# Patient Record
Sex: Female | Born: 1965 | Race: Black or African American | Hispanic: No | Marital: Married | Smoking: Never smoker
Health system: Southern US, Community
[De-identification: ages and names within clinical notes are randomized; demographics above are authoritative.]

## PROBLEM LIST (undated history)

## (undated) DIAGNOSIS — R059 Cough, unspecified: Secondary | ICD-10-CM

## (undated) DIAGNOSIS — R05 Cough: Secondary | ICD-10-CM

## (undated) DIAGNOSIS — E559 Vitamin D deficiency, unspecified: Secondary | ICD-10-CM

## (undated) HISTORY — PX: BLEPHAROPLASTY: SUR158

## (undated) HISTORY — DX: Cough: R05

## (undated) HISTORY — DX: Vitamin D deficiency, unspecified: E55.9

## (undated) HISTORY — DX: Cough, unspecified: R05.9

## (undated) HISTORY — PX: CHOLECYSTECTOMY: SHX55

---

## 2012-04-07 ENCOUNTER — Ambulatory Visit (INDEPENDENT_AMBULATORY_CARE_PROVIDER_SITE_OTHER): Payer: Managed Care, Other (non HMO) | Admitting: Physician Assistant

## 2012-04-07 ENCOUNTER — Ambulatory Visit (INDEPENDENT_AMBULATORY_CARE_PROVIDER_SITE_OTHER): Payer: Managed Care, Other (non HMO)

## 2012-04-07 ENCOUNTER — Encounter: Payer: Self-pay | Admitting: Physician Assistant

## 2012-04-07 VITALS — BP 126/86 | HR 79 | Ht 67.0 in | Wt 201.0 lb

## 2012-04-07 DIAGNOSIS — X500XXA Overexertion from strenuous movement or load, initial encounter: Secondary | ICD-10-CM

## 2012-04-07 DIAGNOSIS — R413 Other amnesia: Secondary | ICD-10-CM

## 2012-04-07 DIAGNOSIS — Z131 Encounter for screening for diabetes mellitus: Secondary | ICD-10-CM

## 2012-04-07 DIAGNOSIS — E559 Vitamin D deficiency, unspecified: Secondary | ICD-10-CM

## 2012-04-07 DIAGNOSIS — M7071 Other bursitis of hip, right hip: Secondary | ICD-10-CM

## 2012-04-07 DIAGNOSIS — M25561 Pain in right knee: Secondary | ICD-10-CM

## 2012-04-07 DIAGNOSIS — S99929A Unspecified injury of unspecified foot, initial encounter: Secondary | ICD-10-CM

## 2012-04-07 DIAGNOSIS — Z13220 Encounter for screening for lipoid disorders: Secondary | ICD-10-CM

## 2012-04-07 DIAGNOSIS — M25569 Pain in unspecified knee: Secondary | ICD-10-CM

## 2012-04-07 DIAGNOSIS — Z1239 Encounter for other screening for malignant neoplasm of breast: Secondary | ICD-10-CM

## 2012-04-07 DIAGNOSIS — M76899 Other specified enthesopathies of unspecified lower limb, excluding foot: Secondary | ICD-10-CM

## 2012-04-07 DIAGNOSIS — D649 Anemia, unspecified: Secondary | ICD-10-CM

## 2012-04-07 MED ORDER — MELOXICAM 7.5 MG PO TABS: 7.5000 mg | ORAL_TABLET | Freq: Every day | ORAL | Status: DC

## 2012-04-07 NOTE — Progress Notes (Signed)
Subjective:    Patient ID: Bethany Murillo, female    DOB: [birth year], 46 y.o.   MRN: [id number]  HPI Patient is a 46 yo female who presents to the clinic to establish care and address some problems she has been having with right hip and knee. PMH was reviewed and + for Vit D deficiency and Anemia. She does not take iron tablets regularly but does take Vit D everyday. She has had blephaplasty and c-section. She does not smoke. She has some new concern she would like addressed today.  Patient has had pain on her right side for the last 2 months. She does not remember any trauma to that area; however she does use her hips a lot to move things around she just found that it easier. When the pain started it was only off and on and has progressed to continuously hurting. She's been taking ibuprofen 800 mg 3 times a day and it has helped a lot. The pain is worse when she lays on her right side better when she is laying flat. Denies any numbness or tingling in extremites, back pain, or radiation of pain.  She has also complained of right knee pain. Last month she was trying to pull her daughter up from the floor and she had a twisting injury to her right knee. Right knee and radiating down the medial side of her right leg was very painful for the first 2-3 days. Since her leg has gotten significantly better. She describes the pain as being minor. She is concerned because sometimes when she moves her leg from flexion to extension there is a click. She also feels like sometimes her knee is going to give way when she's doing normal things such as walking.   She also feels like she has started to have some memory loss. It seems like it is mostly with job responsibilies and things people tell her. She is always aware of where she is and memories from the past. She is not currently making list. This has been ongoing for last 2 months. She has no family hx of dementia.   Review of Systems     Objective:   Physical Exam  Constitutional: She is oriented to person, place, and time. She appears well-developed and well-nourished.  HENT:  Head: Normocephalic and atraumatic.  Cardiovascular: Normal rate, regular rhythm and normal heart sounds.   Pulmonary/Chest: Effort normal and breath sounds normal. She has no wheezes.  Musculoskeletal:        Pain with twisting motion at right knee. No pain with palpation of the joint space in right knee. Anterior Drawer and McMurrays test was negative. NO effusion or swelling. Full ROM without pain.  Right Hip: Full ROM of right hip with some pain illicited with external and internal rotation of right leg. Pinpoint pain over the right hip bursa with pressure. Negative straight leg test.  Neurological: She is alert and oriented to person, place, and time. No cranial nerve deficit. Coordination normal.  Skin: Skin is warm and dry.  Psychiatric: She has a normal mood and affect. Her behavior is normal.          Assessment & Plan:  Right hip bursitis-discussed with patient options of hip injection versus symptomatic treatment and anti-inflammatory. Patient would like to try Mobic 7.5 mg daily along with symptomatic treatment given and handout for the next couple weeks and see if there's any improvement before having hip injection. I would like patient to start using ice  for 15-20 minutes at night over the bursa along with stretches and exercises for the IT band. Will follow up in the next 6 weeks.  Right knee pain-will get x-rays today to evaluate the knee pain from a twisting injury. Will call patient with those results. Cannot completely rule out a meniscal tear; however, we would need MRI to make that diagnosis. The but will likely help the right knee pain also. I encouraged patient to keep me stable, start good strengthening exercises of the right leg in followup in 6 weeks. If right knee pain becomes worse or he starts giving way call office and we can get a MRI  scheduled.  Memory difficulty-I. will check labs today to make sure there is no metabolic reason for memory difficulty. I reassured patient that memory changes such as the one she described can be due to many reasons such as psych disorders, stress, fatigue, or even metabolic reason that we are testing for today. If there is no laboratory reason he can start herbal supplements and then perhaps get a Mini-Mental Status Examination.  I went ahead and gave patient a lab slip for fasting cholesterol and CMP. Also refer patient for mammogram.

## 2012-04-07 NOTE — Patient Instructions (Addendum)
Will call with xray of right knee. Start Mobic daily. Will call with lab results.  Hip Bursitis Bursitis is a swelling and soreness (inflammation) of a fluid-filled sac (bursa). This sac overlies and protects the joints.  CAUSES   Injury.   Overuse of the muscles surrounding the joint.   Arthritis.   Gout.   Infection.   Cold weather.   Inadequate warm-up and conditioning prior to activities.  The cause may not be known.  SYMPTOMS   Mild to severe irritation.   Tenderness and swelling over the outside of the hip.   Pain with motion of the hip.   If the bursa becomes infected, a fever may be present. Redness, tenderness, and warmth will develop over the hip.  Symptoms usually lessen in 3 to 4 weeks with treatment, but can come back. TREATMENT If conservative treatment does not work, your caregiver may advise draining the bursa and injecting cortisone into the area. This may speed up the healing process. This may also be used as an initial treatment of choice. HOME CARE INSTRUCTIONS   Apply ice to the affected area for 15 to 20 minutes every 3 to 4 hours while awake for the first 2 days. Put the ice in a plastic bag and place a towel between the bag of ice and your skin.   Rest the painful joint as much as possible, but continue to put the joint through a normal range of motion at least 4 times per day. When the pain lessens, begin normal, slow movements and usual activities to help prevent stiffness of the hip.   Only take over-the-counter or prescription medicines for pain, discomfort, or fever as directed by your caregiver.   Use crutches to limit weight bearing on the hip joint, if advised.   Elevate your painful hip to reduce swelling. Use pillows for propping and cushioning your legs and hips.   Gentle massage may provide comfort and decrease swelling.  SEEK IMMEDIATE MEDICAL CARE IF:   Your pain increases even during treatment, or you are not improving.   You  have a fever.   You have heat and inflammation over the involved bursa.   You have any other questions or concerns.  MAKE SURE YOU:   Understand these instructions.   Will watch your condition.   Will get help right away if you are not doing well or get worse.  Document Released: 01/05/2002 Document Revised: 07/05/2011 Document Reviewed: 08/04/2008 [facility] Patient Information 2012 [place], [place].

## 2012-04-08 LAB — COMPREHENSIVE METABOLIC PANEL
AST: 22 U/L (ref 0–37)
BUN: 6 mg/dL (ref 6–23)
Calcium: 9.6 mg/dL (ref 8.4–10.5)
Chloride: 107 mEq/L (ref 96–112)
Creat: 0.9 mg/dL (ref 0.50–1.10)

## 2012-04-08 LAB — CBC
Hemoglobin: 12.7 g/dL (ref 12.0–15.0)
MCHC: 33.8 g/dL (ref 30.0–36.0)
RBC: 4.53 MIL/uL (ref 3.87–5.11)

## 2012-04-08 LAB — LIPID PANEL
Cholesterol: 161 mg/dL (ref 0–200)
Triglycerides: 145 mg/dL (ref <150)
VLDL: 29 mg/dL (ref 0–40)

## 2012-04-08 LAB — VITAMIN B12: Vitamin B-12: 625 pg/mL (ref 211–911)

## 2012-04-08 LAB — VITAMIN D 25 HYDROXY (VIT D DEFICIENCY, FRACTURES): Vit D, 25-Hydroxy: 22 ng/mL — ABNORMAL LOW (ref 30–89)

## 2012-04-10 ENCOUNTER — Telehealth: Payer: Self-pay | Admitting: *Deleted

## 2012-04-10 NOTE — Telephone Encounter (Signed)
Pt states her Vit D is 1.25mg , 50,000 units 1 tab weekly and states she does need a refill.

## 2012-04-11 NOTE — Telephone Encounter (Signed)
Ok that's an appropriate dose. Stay on that hopefully over time you will get in normal range at that level. We should recheck in 3 months or so. Once you get to appropriate level we can consider tapering your dose to maintain level with OTC D3.

## 2012-04-11 NOTE — Telephone Encounter (Signed)
LMOM

## 2012-04-22 ENCOUNTER — Ambulatory Visit: Payer: Managed Care, Other (non HMO)

## 2012-05-31 ENCOUNTER — Other Ambulatory Visit: Payer: Self-pay | Admitting: Physician Assistant

## 2012-07-31 ENCOUNTER — Ambulatory Visit (INDEPENDENT_AMBULATORY_CARE_PROVIDER_SITE_OTHER): Payer: Managed Care, Other (non HMO) | Admitting: Physician Assistant

## 2012-07-31 ENCOUNTER — Encounter: Payer: Self-pay | Admitting: Physician Assistant

## 2012-07-31 VITALS — BP 133/86 | HR 90 | Temp 98.3°F | Resp 18 | Wt 207.0 lb

## 2012-07-31 DIAGNOSIS — B379 Candidiasis, unspecified: Secondary | ICD-10-CM

## 2012-07-31 DIAGNOSIS — J40 Bronchitis, not specified as acute or chronic: Secondary | ICD-10-CM

## 2012-07-31 DIAGNOSIS — E559 Vitamin D deficiency, unspecified: Secondary | ICD-10-CM

## 2012-07-31 DIAGNOSIS — J069 Acute upper respiratory infection, unspecified: Secondary | ICD-10-CM

## 2012-07-31 DIAGNOSIS — B49 Unspecified mycosis: Secondary | ICD-10-CM

## 2012-07-31 MED ORDER — HYDROCODONE-HOMATROPINE 5-1.5 MG/5ML PO SYRP: 5.0000 mL | ORAL_SOLUTION | Freq: Every evening | ORAL | Status: DC | PRN

## 2012-07-31 MED ORDER — FLUCONAZOLE 150 MG PO TABS: ORAL_TABLET | ORAL | Status: DC

## 2012-07-31 MED ORDER — AMOXICILLIN-POT CLAVULANATE 875-125 MG PO TABS: 1.0000 | ORAL_TABLET | Freq: Two times a day (BID) | ORAL | Status: DC

## 2012-07-31 NOTE — Patient Instructions (Addendum)
Start Augmentin for 10 days and hycodan for cough.   Diflucan for yeast.   Bronchitis Bronchitis is the body's way of reacting to injury and/or infection (inflammation) of the bronchi. Bronchi are the air tubes that extend from the windpipe into the lungs. If the inflammation becomes severe, it may cause shortness of breath. CAUSES  Inflammation may be caused by:  A virus.  Germs (bacteria).  Dust.  Allergens.  Pollutants and many other irritants. The cells lining the bronchial tree are covered with tiny hairs (cilia). These constantly beat upward, away from the lungs, toward the mouth. This keeps the lungs free of pollutants. When these cells become too irritated and are unable to do their job, mucus begins to develop. This causes the characteristic cough of bronchitis. The cough clears the lungs when the cilia are unable to do their job. Without either of these protective mechanisms, the mucus would settle in the lungs. Then you would develop pneumonia. Smoking is a common cause of bronchitis and can contribute to pneumonia. Stopping this habit is the single most important thing you can do to help yourself. TREATMENT   Your caregiver may prescribe an antibiotic if the cough is caused by bacteria. Also, medicines that open up your airways make it easier to breathe. Your caregiver may also recommend or prescribe an expectorant. It will loosen the mucus to be coughed up. Only take over-the-counter or prescription medicines for pain, discomfort, or fever as directed by your caregiver.  Removing whatever causes the problem (smoking, for example) is critical to preventing the problem from getting worse.  Cough suppressants may be prescribed for relief of cough symptoms.  Inhaled medicines may be prescribed to help with symptoms now and to help prevent problems from returning.  For those with recurrent (chronic) bronchitis, there may be a need for steroid medicines. SEEK IMMEDIATE MEDICAL  CARE IF:   During treatment, you develop more pus-like mucus (purulent sputum).  You have a fever.  Your baby is older than 3 months with a rectal temperature of 102 F (38.9 C) or higher.  Your baby is 38 months old or younger with a rectal temperature of 100.4 F (38 C) or higher.  You become progressively more ill.  You have increased difficulty breathing, wheezing, or shortness of breath. It is necessary to seek immediate medical care if you are elderly or sick from any other disease. MAKE SURE YOU:   Understand these instructions.  Will watch your condition.  Will get help right away if you are not doing well or get worse. Document Released: 07/16/2005 Document Revised: 10/08/2011 Document Reviewed: 05/25/2008 [facility] Patient Information 2013 [place], [place].

## 2012-07-31 NOTE — Progress Notes (Signed)
  Subjective:    Patient ID: Bethany Murillo, female    DOB: [birth year], 47 y.o.   MRN: [id number]  HPI She has had a ST, cough, sinus and nasal congestion for 2 weeks. She felt like her throat ws closing up. She took benadryl and did help some. The next day she found some left of amoxicillin. She has taken 3 pills and felt much better for 2-3 days and now the pain and cough have started back up. Hurts to swallow. She also has noticed white thick vaginal discharge that is itchy. She usually can remedy with 7 day monistat but has not worked this time. She denies fever, chills, muscle aches, or ear pain.     History of vit D deficiency. Needs rechecked. Taking 50,000 weekly.   Review of Systems     Objective:   Physical Exam  Constitutional: She is oriented to person, place, and time. She appears well-developed and well-nourished.  HENT:  Head: Normocephalic and atraumatic.  Right Ear: External ear normal.  Left Ear: External ear normal.       TM's clear bilaterally.  Oropharynx erythematous and swollen tonsils. No exudate.  Turbinates red and swollen.  Eyes: Conjunctivae normal are normal.  Neck: Normal range of motion. Neck supple.  Cardiovascular: Normal rate, regular rhythm and normal heart sounds.   Pulmonary/Chest: Effort normal and breath sounds normal. She has no wheezes.  Lymphadenopathy:    She has cervical adenopathy.  Neurological: She is alert and oriented to person, place, and time.  Skin: Skin is warm and dry.  Psychiatric: She has a normal mood and affect. Her behavior is normal.          Assessment & Plan:  URI/Bronchitis-since started abx and felt better will give enoucgh for pt to finish full cycle.Gave handout and discussed symptomatic care.Follow up if not improving.cough syrup given for cough.  Yeast infection-hx of yeast infection after abx and current symptoms. Will give one diflucan for now and then after abx treatment.  Vitamin D- Will recheck level  today.

## 2012-08-01 ENCOUNTER — Telehealth: Payer: Self-pay | Admitting: *Deleted

## 2012-08-01 DIAGNOSIS — M25561 Pain in right knee: Secondary | ICD-10-CM

## 2012-08-01 LAB — VITAMIN D 25 HYDROXY (VIT D DEFICIENCY, FRACTURES): Vit D, 25-Hydroxy: 27 ng/mL — ABNORMAL LOW (ref 30–89)

## 2012-08-01 NOTE — Telephone Encounter (Signed)
Pt called and would like to go ahead w/ the MRI. Please order.

## 2012-08-04 NOTE — Telephone Encounter (Signed)
Patient's husband advised 

## 2012-08-04 NOTE — Telephone Encounter (Signed)
Let pt know referral was sent should be called soon with appt.

## 2012-08-12 ENCOUNTER — Other Ambulatory Visit: Payer: Managed Care, Other (non HMO)

## 2012-08-12 ENCOUNTER — Telehealth: Payer: Self-pay | Admitting: *Deleted

## 2012-08-12 NOTE — Telephone Encounter (Signed)
Pt states that she is needing a prescription for vit D. Looks like on med list it was OTC.  Please clarify

## 2012-08-13 MED ORDER — ERGOCALCIFEROL 1.25 MG (50000 UT) PO CAPS: ORAL_CAPSULE | ORAL | Status: DC

## 2012-08-13 NOTE — Telephone Encounter (Signed)
Sent to pharm ....

## 2012-08-13 NOTE — Telephone Encounter (Signed)
Spoke with husband.  He will inform pt.

## 2012-10-31 ENCOUNTER — Telehealth: Payer: Self-pay | Admitting: Physician Assistant

## 2012-10-31 NOTE — Telephone Encounter (Signed)
Message copied by Jomarie Longs on Fri Oct 31, 2012 12:15 PM ------      Message from: Tandy Gaw L      Created: Mon Aug 04, 2012  9:07 AM       Vitamin D recheck.  ------

## 2012-10-31 NOTE — Telephone Encounter (Signed)
Left message on vm for pt to call back.  

## 2012-10-31 NOTE — Telephone Encounter (Signed)
Reminder for vitamin D recheck? Would you like for [place] to send lab order to lab?

## 2012-11-04 NOTE — Telephone Encounter (Signed)
Left detailed message on machine to call us back and let us know when she would like to get Vit D level rechecked and we would send order to lab. Barry Dienes, LPN

## 2013-01-12 ENCOUNTER — Encounter: Payer: Self-pay | Admitting: Physician Assistant

## 2013-01-12 ENCOUNTER — Ambulatory Visit (INDEPENDENT_AMBULATORY_CARE_PROVIDER_SITE_OTHER): Payer: Managed Care, Other (non HMO) | Admitting: Physician Assistant

## 2013-01-12 ENCOUNTER — Telehealth: Payer: Self-pay | Admitting: Physician Assistant

## 2013-01-12 VITALS — BP 113/76 | HR 68 | Wt 203.0 lb

## 2013-01-12 DIAGNOSIS — M25569 Pain in unspecified knee: Secondary | ICD-10-CM

## 2013-01-12 DIAGNOSIS — M25561 Pain in right knee: Secondary | ICD-10-CM

## 2013-01-12 DIAGNOSIS — E559 Vitamin D deficiency, unspecified: Secondary | ICD-10-CM

## 2013-01-12 DIAGNOSIS — N898 Other specified noninflammatory disorders of vagina: Secondary | ICD-10-CM

## 2013-01-12 DIAGNOSIS — R05 Cough: Secondary | ICD-10-CM

## 2013-01-12 MED ORDER — ALBUTEROL SULFATE HFA 108 (90 BASE) MCG/ACT IN AERS: 2.0000 | INHALATION_SPRAY | Freq: Four times a day (QID) | RESPIRATORY_TRACT | Status: DC | PRN

## 2013-01-12 NOTE — Patient Instructions (Addendum)
Knee Exercises EXERCISES RANGE OF MOTION(ROM) AND STRETCHING EXERCISES These exercises may help you when beginning to rehabilitate your injury. Your symptoms may resolve with or without further involvement from your physician, physical therapist or athletic trainer. While completing these exercises, remember:   Restoring tissue flexibility helps normal motion to return to the joints. This allows healthier, less painful movement and activity.  An effective stretch should be held for at least 30 seconds.  A stretch should never be painful. You should only feel a gentle lengthening or release in the stretched tissue. STRETCH - Knee Extension, Prone  Lie on your stomach on a firm surface, such as a bed or countertop. Place your right / left knee and leg just beyond the edge of the surface. You may wish to place a towel under the far end of your right / left thigh for comfort.  Relax your leg muscles and allow gravity to straighten your knee. Your clinician may advise you to add an ankle weight if more resistance is helpful for you.  You should feel a stretch in the back of your right / left knee. Hold this position for __________ seconds. Repeat __________ times. Complete this stretch __________ times per day. * Your physician, physical therapist or athletic trainer may ask you to add ankle weight to enhance your stretch.  RANGE OF MOTION - Knee Flexion, Active  Lie on your back with both knees straight. (If this causes back discomfort, bend your opposite knee, placing your foot flat on the floor.)  Slowly slide your heel back toward your buttocks until you feel a gentle stretch in the front of your knee or thigh.  Hold for __________ seconds. Slowly slide your heel back to the starting position. Repeat __________ times. Complete this exercise __________ times per day.  STRETCH - Quadriceps, Prone   Lie on your stomach on a firm surface, such as a bed or padded floor.  Bend your right /  left knee and grasp your ankle. If you are unable to reach, your ankle or pant leg, use a belt around your foot to lengthen your reach.  Gently pull your heel toward your buttocks. Your knee should not slide out to the side. You should feel a stretch in the front of your thigh and/or knee.  Hold this position for __________ seconds. Repeat __________ times. Complete this stretch __________ times per day.  STRETCH  Hamstrings, Supine   Lie on your back. Loop a belt or towel over the ball of your right / left foot.  Straighten your right / left knee and slowly pull on the belt to raise your leg. Do not allow the right / left knee to bend. Keep your opposite leg flat on the floor.  Raise the leg until you feel a gentle stretch behind your right / left knee or thigh. Hold this position for __________ seconds. Repeat __________ times. Complete this stretch __________ times per day.  STRENGTHENING EXERCISES These exercises may help you when beginning to rehabilitate your injury. They may resolve your symptoms with or without further involvement from your physician, physical therapist or athletic trainer. While completing these exercises, remember:   Muscles can gain both the endurance and the strength needed for everyday activities through controlled exercises.  Complete these exercises as instructed by your physician, physical therapist or athletic trainer. Progress the resistance and repetitions only as guided.  You may experience muscle soreness or fatigue, but the pain or discomfort you are trying to eliminate should   never worsen during these exercises. If this pain does worsen, stop and make certain you are following the directions exactly. If the pain is still present after adjustments, discontinue the exercise until you can discuss the trouble with your clinician. STRENGTH - Quadriceps, Isometrics  Lie on your back with your right / left leg extended and your opposite knee bent.  Gradually  tense the muscles in the front of your right / left thigh. You should see either your knee cap slide up toward your hip or increased dimpling just above the knee. This motion will push the back of the knee down toward the floor/mat/bed on which you are lying.  Hold the muscle as tight as you can without increasing your pain for __________ seconds.  Relax the muscles slowly and completely in between each repetition. Repeat __________ times. Complete this exercise __________ times per day.  STRENGTH - Quadriceps, Short Arcs   Lie on your back. Place a __________ inch towel roll under your knee so that the knee slightly bends.  Raise only your lower leg by tightening the muscles in the front of your thigh. Do not allow your thigh to rise.  Hold this position for __________ seconds. Repeat __________ times. Complete this exercise __________ times per day.  OPTIONAL ANKLE WEIGHTS: Begin with ____________________, but DO NOT exceed ____________________. Increase in 1 pound/0.5 kilogram increments.  STRENGTH - Quadriceps, Straight Leg Raises  Quality counts! Watch for signs that the quadriceps muscle is working to insure you are strengthening the correct muscles and not "cheating" by substituting with healthier muscles.  Lay on your back with your right / left leg extended and your opposite knee bent.  Tense the muscles in the front of your right / left thigh. You should see either your knee cap slide up or increased dimpling just above the knee. Your thigh may even quiver.  Tighten these muscles even more and raise your leg 4 to 6 inches off the floor. Hold for __________ seconds.  Keeping these muscles tense, lower your leg.  Relax the muscles slowly and completely in between each repetition. Repeat __________ times. Complete this exercise __________ times per day.  STRENGTH - Hamstring, Curls  Lay on your stomach with your legs extended. (If you lay on a bed, your feet may hang over the  edge.)  Tighten the muscles in the back of your thigh to bend your right / left knee up to 90 degrees. Keep your hips flat on the bed/floor.  Hold this position for __________ seconds.  Slowly lower your leg back to the starting position. Repeat __________ times. Complete this exercise __________ times per day.  OPTIONAL ANKLE WEIGHTS: Begin with ____________________, but DO NOT exceed ____________________. Increase in 1 pound/0.5 kilogram increments.  STRENGTH  Quadriceps, Squats  Stand in a door frame so that your feet and knees are in line with the frame.  Use your hands for balance, not support, on the frame.  Slowly lower your weight, bending at the hips and knees. Keep your lower legs upright so that they are parallel with the door frame. Squat only within the range that does not increase your knee pain. Never let your hips drop below your knees.  Slowly return upright, pushing with your legs, not pulling with your hands. Repeat __________ times. Complete this exercise __________ times per day.  STRENGTH - Quadriceps, Wall Slides  Follow guidelines for form closely. Increased knee pain often results from poorly placed feet or knees.  Lean against   a smooth wall or door and walk your feet out 18-24 inches. Place your feet hip-width apart.  Slowly slide down the wall or door until your knees bend __________ degrees.* Keep your knees over your heels, not your toes, and in line with your hips, not falling to either side.  Hold for __________ seconds. Stand up to rest for __________ seconds in between each repetition. Repeat __________ times. Complete this exercise __________ times per day. * Your physician, physical therapist or athletic trainer will alter this angle based on your symptoms and progress. Document Released: 05/30/2005 Document Revised: 10/08/2011 Document Reviewed: 10/28/2008 [facility] Patient Information 2014 [place], [place]. Candida Infection, Adult A candida  infection (also called yeast, fungus and Monilia infection) is an overgrowth of yeast that can occur anywhere on the body. A yeast infection commonly occurs in warm, moist body areas. Usually, the infection remains localized but can spread to become a systemic infection. A yeast infection may be a sign of a more severe disease such as diabetes, leukemia, or AIDS. A yeast infection can occur in both men and women. In women, Candida vaginitis is a vaginal infection. It is one of the most common causes of vaginitis. Men usually do not have symptoms or know they have an infection until other problems develop. Men may find out they have a yeast infection because their sex partner has a yeast infection. Uncircumcised men are more likely to get a yeast infection than circumcised men. This is because the uncircumcised glans is not exposed to air and does not remain as dry as that of a circumcised glans. Older adults may develop yeast infections around dentures. CAUSES  Women  Antibiotics.  Steroid medication taken for a long time.  Being overweight (obese).  Diabetes.  Poor immune condition.  Certain serious medical conditions.  Immune suppressive medications for organ transplant patients.  Chemotherapy.  Pregnancy.  Menstration.  Stress and fatigue.  Intravenous drug use.  Oral contraceptives.  Wearing tight-fitting clothes in the crotch area.  Catching it from a sex partner who has a yeast infection.  Spermicide.  Intravenous, urinary, or other catheters. Men  Catching it from a sex partner who has a yeast infection.  Having oral or anal sex with a person who has the infection.  Spermicide.  Diabetes.  Antibiotics.  Poor immune system.  Medications that suppress the immune system.  Intravenous drug use.  Intravenous, urinary, or other catheters. SYMPTOMS  Women  Thick, white vaginal discharge.  Vaginal itching.  Redness and swelling in and around the  vagina.  Irritation of the lips of the vagina and perineum.  Blisters on the vaginal lips and perineum.  Painful sexual intercourse.  Low blood sugar (hypoglycemia).  Painful urination.  Bladder infections.  Intestinal problems such as constipation, indigestion, bad breath, bloating, increase in gas, diarrhea, or loose stools. Men  Men may develop intestinal problems such as constipation, indigestion, bad breath, bloating, increase in gas, diarrhea, or loose stools.  Dry, cracked skin on the penis with itching or discomfort.  Jock itch.  Dry, flaky skin.  Athlete's foot.  Hypoglycemia. DIAGNOSIS  Women  A history and an exam are performed.  The discharge may be examined under a microscope.  A culture may be taken of the discharge. Men  A history and an exam are performed.  Any discharge from the penis or areas of cracked skin will be looked at under the microscope and cultured.  Stool samples may be cultured. TREATMENT  Women  Vaginal  antifungal suppositories and creams.  Medicated creams to decrease irritation and itching on the outside of the vagina.  Warm compresses to the perineal area to decrease swelling and discomfort.  Oral antifungal medications.  Medicated vaginal suppositories or cream for repeated or recurrent infections.  Wash and dry the irritation areas before applying the cream.  Eating yogurt with lactobacillus may help with prevention and treatment.  Sometimes painting the vagina with gentian violet solution may help if creams and suppositories do not work. Men  Antifungal creams and oral antifungal medications.  Sometimes treatment must continue for 30 days after the symptoms go away to prevent recurrence. HOME CARE INSTRUCTIONS  Women  Use cotton underwear and avoid tight-fitting clothing.  Avoid colored, scented toilet paper and deodorant tampons or pads.  Do not douche.  Keep your diabetes under control.  Finish all  the prescribed medications.  Keep your skin clean and dry.  Consume milk or yogurt with lactobacillus active culture regularly. If you get frequent yeast infections and think that is what the infection is, there are over-the-counter medications that you can get. If the infection does not show healing in 3 days, talk to your caregiver.  Tell your sex partner you have a yeast infection. Your partner may need treatment also, especially if your infection does not clear up or recurs. Men  Keep your skin clean and dry.  Keep your diabetes under control.  Finish all prescribed medications.  Tell your sex partner that you have a yeast infection so they can be treated if necessary. SEEK MEDICAL CARE IF:   Your symptoms do not clear up or worsen in one week after treatment.  You have an oral temperature above 102 F (38.9 C).  You have trouble swallowing or eating for a prolonged time.  You develop blisters on and around your vagina.  You develop vaginal bleeding and it is not your menstrual period.  You develop abdominal pain.  You develop intestinal problems as mentioned above.  You get weak or lightheaded.  You have painful or increased urination.  You have pain during sexual intercourse. MAKE SURE YOU:   Understand these instructions.  Will watch your condition.  Will get help right away if you are not doing well or get worse. Document Released: 08/23/2004 Document Revised: 10/08/2011 Document Reviewed: 12/05/2009 [facility] Patient Information 2014 [place], [place].

## 2013-01-12 NOTE — Telephone Encounter (Signed)
Patient was seen today and she does want to have the reaction knee brace and what are the steps to get that. If you can call her back [phone]. Thanks

## 2013-01-12 NOTE — Progress Notes (Signed)
  Subjective:    Patient ID: Bethany Murillo, female    DOB: [birth year], 47 y.o.   MRN: [id number]  HPI Patient presents to the clinic to follow up on vitamin D levels, right knee pain, vaginal itching and cough.   Vitamin D has not always been consistent with taking D3 but has taken for last month. She does feel like she has more energy.   Right knee pain has improved but continues to catch and give way at least a couple of times a week. Pain is dull and achy. Never fallen due to knee complication. Takes ibuprofen occasionally but not regularly. Does help some. X-rays done at last visit did not show any significant degeneration.  Pt has had multiple yeast infections this year. She reports she has went to [facility]. She feels like one is starting today because she has started to feel more irritated vaginally. No discharge today. Not aware of any triggers. Taking probiotic.   Pt concerned with cough today. She usually has 2-3 cases of bronchitis every year. She denies any fever, chills, SOB, n/v/d, sinus pressure, ear pain or ST. Her cough is dry. Denies any wheezing. Never had any dx of asthma.      Review of Systems     Objective:   Physical Exam  Constitutional: She appears well-developed and well-nourished.  HENT:  Head: Normocephalic and atraumatic.  Right Ear: External ear normal.  Left Ear: External ear normal.  Nose: Nose normal.  Mouth/Throat: Oropharynx is clear and moist.  Eyes: Conjunctivae are normal.  Neck: Normal range of motion. Neck supple.  Cardiovascular: Normal rate, regular rhythm and normal heart sounds.   Pulmonary/Chest: Effort normal and breath sounds normal. She has no wheezes.  Musculoskeletal:  Right knee: NOrmal ROM. Strength 5/5. No joint tenderness. Twisting motion creates pain medially. McMurrays negative. Anterior drawer negative.   Lymphadenopathy:    She has no cervical adenopathy.  Skin: Skin is warm and dry.  Psychiatric: She has a normal mood  and affect. Her behavior is normal.          Assessment & Plan:  Vitamin D def- will recheck Vit D level.   Right knee pain- suspect meniscal tear in process of healing but perhaps being re-injured. Order MRI earlier patient did not get. Offered to put in knee brace for stablilty and along with PT at home and regular NSAIDs see if improves. Pt declined knee brace today. Follow up if worsening. 38month f/u  Cough-Peak flows were all in the green. REassured pt that cough could be due to PND. Discussed allergy meds daily such as zyrtec/claritin. Decongestant for a couple of days may help PND. Follow up if not improving.   Vaginal itching- Wet prep done today. Will call with results. Could be having some vaginal atropy setting in as approaching menopause. Discussed lubrication and ways to prevent yeast infection/BV handout given.

## 2013-01-13 LAB — VITAMIN D 25 HYDROXY (VIT D DEFICIENCY, FRACTURES): Vit D, 25-Hydroxy: 43 ng/mL (ref 30–89)

## 2013-01-14 DIAGNOSIS — E559 Vitamin D deficiency, unspecified: Secondary | ICD-10-CM

## 2013-01-14 HISTORY — DX: Vitamin D deficiency, unspecified: E55.9

## 2013-01-14 NOTE — Telephone Encounter (Signed)
Please schedule with patient to come in for a nurse visit to get brace placed.

## 2013-01-15 NOTE — Telephone Encounter (Signed)
LMOM for pt to call back & schedule.

## 2013-01-20 NOTE — Telephone Encounter (Signed)
Left another message for pt to schedule nurse visit if she still feels like she needs the knee brace.

## 2013-01-23 ENCOUNTER — Encounter: Payer: Self-pay | Admitting: Physician Assistant

## 2013-01-23 ENCOUNTER — Ambulatory Visit (INDEPENDENT_AMBULATORY_CARE_PROVIDER_SITE_OTHER): Payer: BC Managed Care – PPO | Admitting: Physician Assistant

## 2013-01-23 VITALS — BP 137/91 | HR 67 | Wt 203.0 lb

## 2013-01-23 DIAGNOSIS — R05 Cough: Secondary | ICD-10-CM

## 2013-01-23 DIAGNOSIS — R062 Wheezing: Secondary | ICD-10-CM

## 2013-01-23 DIAGNOSIS — M25561 Pain in right knee: Secondary | ICD-10-CM

## 2013-01-23 DIAGNOSIS — M25569 Pain in unspecified knee: Secondary | ICD-10-CM

## 2013-01-23 DIAGNOSIS — J209 Acute bronchitis, unspecified: Secondary | ICD-10-CM

## 2013-01-23 MED ORDER — HYDROCODONE-HOMATROPINE 5-1.5 MG/5ML PO SYRP: 5.0000 mL | ORAL_SOLUTION | Freq: Every evening | ORAL | Status: DC | PRN

## 2013-01-23 MED ORDER — ALBUTEROL SULFATE (2.5 MG/3ML) 0.083% IN NEBU
2.5000 mg | INHALATION_SOLUTION | Freq: Once | RESPIRATORY_TRACT | Status: AC
  Administered 2013-01-23: 2.5 mg via RESPIRATORY_TRACT

## 2013-01-23 MED ORDER — PREDNISONE 20 MG PO TABS: ORAL_TABLET | ORAL | Status: DC

## 2013-01-23 MED ORDER — AZITHROMYCIN 250 MG PO TABS: ORAL_TABLET | ORAL | Status: DC

## 2013-01-23 MED ORDER — METHYLPREDNISOLONE SODIUM SUCC 125 MG IJ SOLR
125.0000 mg | Freq: Once | INTRAMUSCULAR | Status: AC
  Administered 2013-01-23: 125 mg via INTRAMUSCULAR

## 2013-01-23 NOTE — Patient Instructions (Addendum)

## 2013-01-23 NOTE — Progress Notes (Signed)
  Subjective:    Patient ID: Bethany Murillo. Bethany Murillo, female    DOB: 1966-05-24, 47 y.o.   MRN: [id number]  HPI Patient presents to the clinic with wet productive cough and feeling tight and SOB. She had a cough last Friday when was seen in clinic for other reasons but though was just a cold. Her cough continues to get worse. She is very tight in her chest, SOB and wheezing. She has used inhaler and does help. She has not been diagnosed with any lung disorders. She denies any sinus pressure, ST, ear pain. Her sputum is greenish/white.    Pt would like to get knee brace that we talked about last Friday.    Review of Systems     Objective:   Physical Exam  Constitutional: She is oriented to person, place, and time. She appears well-developed and well-nourished.  HENT:  Head: Normocephalic and atraumatic.  Right Ear: External ear normal.  Left Ear: External ear normal.  Nose: Nose normal.  Mouth/Throat: Oropharynx is clear and moist. No oropharyngeal exudate.  Eyes: Conjunctivae are normal. Right eye exhibits no discharge. Left eye exhibits no discharge.  Neck: Normal range of motion. Neck supple. No thyromegaly present.  Cardiovascular: Normal rate, regular rhythm and normal heart sounds.   Pulmonary/Chest:  Decreased effort. Bilateral wheezing and rhonchi. Wet hacking cough.   Lymphadenopathy:    She has no cervical adenopathy.  Neurological: She is alert and oriented to person, place, and time.  Skin: Skin is warm and dry.  Psychiatric: She has a normal mood and affect. Her behavior is normal.          Assessment & Plan:  Acute bronchitis- Albuterol nebulizer given in office. Solumedrol 125mg  given IM. Prednisone for 5 days and zpak sent to pharmacy. Continue to use albuterol inhaler as needed. Hycodan given for cough at night. If not improving by Monday call office. Discussed exacerbations have been more frequent than would like. Would like to call and set up spironmetry when  feeling better.   Right knee pain- placed in knee support brace today. Continue with home PT. Ibprofen regularly. Follow up if not improving in next 4 weeks.

## 2013-01-26 NOTE — Telephone Encounter (Signed)
Pt came in for ov & received knee brace.

## 2013-01-28 ENCOUNTER — Encounter: Payer: Self-pay | Admitting: Family Medicine

## 2013-01-28 ENCOUNTER — Ambulatory Visit (INDEPENDENT_AMBULATORY_CARE_PROVIDER_SITE_OTHER): Payer: BC Managed Care – PPO | Admitting: Family Medicine

## 2013-01-28 ENCOUNTER — Ambulatory Visit (INDEPENDENT_AMBULATORY_CARE_PROVIDER_SITE_OTHER): Payer: BC Managed Care – PPO

## 2013-01-28 VITALS — BP 127/86 | HR 76 | Temp 98.6°F | Wt 202.0 lb

## 2013-01-28 DIAGNOSIS — R05 Cough: Secondary | ICD-10-CM

## 2013-01-28 DIAGNOSIS — R059 Cough, unspecified: Secondary | ICD-10-CM

## 2013-01-28 DIAGNOSIS — J4531 Mild persistent asthma with (acute) exacerbation: Secondary | ICD-10-CM

## 2013-01-28 DIAGNOSIS — J45901 Unspecified asthma with (acute) exacerbation: Secondary | ICD-10-CM

## 2013-01-28 DIAGNOSIS — R0602 Shortness of breath: Secondary | ICD-10-CM

## 2013-01-28 MED ORDER — PREDNISONE 20 MG PO TABS: ORAL_TABLET | ORAL | Status: AC

## 2013-01-28 MED ORDER — LEVOFLOXACIN 750 MG PO TABS: 750.0000 mg | ORAL_TABLET | Freq: Every day | ORAL | Status: AC

## 2013-01-28 MED ORDER — IPRATROPIUM BROMIDE HFA 17 MCG/ACT IN AERS: 2.0000 | INHALATION_SPRAY | Freq: Four times a day (QID) | RESPIRATORY_TRACT | Status: DC

## 2013-01-28 NOTE — Progress Notes (Signed)
CC: Bethany Murillo is a 47 y.o. female is here for Shortness of Breath   Subjective: HPI:  Patient presents for cough that has been present for 6 weeks. She was seen last Friday for the same symptoms described as severe cough, moderate shortness of breath both of which are worse with exertion or lying down at night. Prior to being seen last week and are been on azithromycin. When she saw our office last week she was started on azithromycin again, received Solu-Medrol and started on prednisone taper beginning at 40 mg a day which she is continued on. Unfortunately symptoms were worsening from a breathing standpoint and she was seen on the 30th at [facility]. Antibiotic was changed to clarithromycin an x-ray was obtained to read shows no airway abnormality.  Today she states she feels better from an anxiety standpoint compared to when she was at [facility] but cough and shortness of breath has not improved to any degree. Symptoms are slightly improved with albuterol and codeine cough syrup only temporarily. She is unable to get more than 2-4 hours of sleep due to the cough waking her.  She currently denies fevers, chills, nausea, vomiting, appetite change, myalgias, facial pain, nasal congestion, headache motor or sensory disturbances. She reports chest congestion but no chest pain and less coughing which results in moderate tightness.Marland Kitchen describes her cough is productive without blood.   Review Of Systems Outlined In HPI  Past Medical History  Diagnosis Date  . Unspecified vitamin D deficiency 01/14/2013     Family History  Problem Relation Age of Onset  . Cancer Mother     colon cancer  . Stroke Father   . Cancer Maternal Grandmother      History  Substance Use Topics  . Smoking status: Never Smoker   . Smokeless tobacco: Not on file  . Alcohol Use: No     Objective: Filed Vitals:   01/28/13 1309  BP: 127/86  Pulse: 76  Temp: 98.6 F (37 C)    General: Alert and Oriented, No  Acute Distress HEENT: Pupils equal, round, reactive to light. Conjunctivae clear.  External ears unremarkable, canals clear with intact TMs with appropriate landmarks.  Middle ear appears open without effusion. Pink inferior turbinates.  Moist mucous membranes, pharynx without inflammation nor lesions.  Neck supple without palpable lymphadenopathy nor abnormal masses. Lungs: Diffuse moderate to severe rhonchi in all lung fields with good air movement without wheezing nor rales nor signs of consolidation. Frequent coughing. Rhonchi can be heard standing next to her while talking Cardiac: Regular rate and rhythm. Normal S1/S2.  No murmurs, rubs, nor gallops.   Extremities: No peripheral edema.  Strong peripheral pulses.  Mental Status: No depression, anxiety, nor agitation. Skin: Warm and dry.  Assessment & Plan: Bethany Murillo was seen today for shortness of breath.  Diagnoses and associated orders for this visit:  Cough - DG Chest 2 View; Future - ipratropium (ATROVENT HFA) 17 MCG/ACT inhaler; Inhale 2 puffs into the lungs 4 (four) times daily. - predniSONE (DELTASONE) 20 MG tablet; Three tabs daily days 1-3, two tabs daily days 4-6, one tab daily days 7-9, half tab daily days 10-13. - levofloxacin (LEVAQUIN) 750 MG tablet; Take 1 tablet (750 mg total) by mouth daily.  Reactive airway disease, mild persistent, with acute exacerbation - ipratropium (ATROVENT HFA) 17 MCG/ACT inhaler; Inhale 2 puffs into the lungs 4 (four) times daily. - predniSONE (DELTASONE) 20 MG tablet; Three tabs daily days 1-3, two tabs daily days  4-6, one tab daily days 7-9, half tab daily days 10-13. - levofloxacin (LEVAQUIN) 750 MG tablet; Take 1 tablet (750 mg total) by mouth daily.    Chest x-ray was obtained and reviewed with the patient showing no acute airspace disease, furthermore oxygen saturation without desaturation while walking reassurance provided. Stepup therapy with adding ipratropium to albuterol scheduled  every 4 hours for the next 48 hours then as needed, increasing prednisone taper 60 mg, stop clarithromycin begin Levaquin.Signs and symptoms requring emergent/urgent reevaluation were discussed with the patient. Followup PCP 1 week  Return in about 1 week (around 02/05/2013).

## 2013-04-15 ENCOUNTER — Ambulatory Visit: Payer: BC Managed Care – PPO | Admitting: Physician Assistant

## 2013-11-13 ENCOUNTER — Encounter: Payer: Self-pay | Admitting: Physician Assistant

## 2013-11-13 ENCOUNTER — Ambulatory Visit (INDEPENDENT_AMBULATORY_CARE_PROVIDER_SITE_OTHER): Payer: BC Managed Care – PPO | Admitting: Physician Assistant

## 2013-11-13 VITALS — BP 127/81 | HR 76 | Temp 98.0°F | Ht 67.0 in | Wt 211.0 lb

## 2013-11-13 DIAGNOSIS — R053 Chronic cough: Secondary | ICD-10-CM

## 2013-11-13 DIAGNOSIS — J45909 Unspecified asthma, uncomplicated: Secondary | ICD-10-CM

## 2013-11-13 DIAGNOSIS — R059 Cough, unspecified: Secondary | ICD-10-CM

## 2013-11-13 DIAGNOSIS — R062 Wheezing: Secondary | ICD-10-CM

## 2013-11-13 DIAGNOSIS — R05 Cough: Secondary | ICD-10-CM

## 2013-11-13 MED ORDER — BENZONATATE 200 MG PO CAPS: 200.0000 mg | ORAL_CAPSULE | Freq: Three times a day (TID) | ORAL | Status: DC | PRN

## 2013-11-13 MED ORDER — METHYLPREDNISOLONE ACETATE 80 MG/ML IJ SUSP
80.0000 mg | Freq: Once | INTRAMUSCULAR | Status: AC
  Administered 2013-11-13: 80 mg via INTRAMUSCULAR

## 2013-11-13 MED ORDER — MONTELUKAST SODIUM 10 MG PO TABS: 10.0000 mg | ORAL_TABLET | Freq: Every day | ORAL | Status: DC

## 2013-11-13 MED ORDER — HYDROCOD POLST-CHLORPHEN POLST 10-8 MG/5ML PO LQCR: 5.0000 mL | Freq: Two times a day (BID) | ORAL | Status: DC | PRN

## 2013-11-13 MED ORDER — FLUTICASONE-SALMETEROL 100-50 MCG/DOSE IN AEPB: 1.0000 | INHALATION_SPRAY | Freq: Two times a day (BID) | RESPIRATORY_TRACT | Status: DC

## 2013-11-13 NOTE — Progress Notes (Addendum)
   Subjective:    Patient ID: Bethany SchleinAngela M. Ashley RoyaltyMatthews, female    DOB: 04/29/1966, 48 y.o.   MRN: [id number]  HPI Pt presents to the clinic with cough that has been persistent for 4 months. She has been being seen by [facility] due to work schedule. She reports taking 5 abx and at least 5-6 rounds of steroids. She does get some better with abx and steroid but usually only last for a couple of days or so. She was sent to pulmonologist but he was sleep focused and did not seem to listen. He gave her bactrim and said she needed a sleep study. All CXR have been negative for any infiltrates. She did have positive IGG pertussis which she was treated despite IgM being negative. She went on vacation for 1 week and did get some better. When she got back to work extreme constant cough continued. She had vents cleaned at work. Not taking anything daily. Does have as needed inhaler that she does use with benefit some. Her cough is productive and day and night. She has started to have some wheezing as well. No fever, chills, nausea, vomiting, sinus pressure, ear pain, ST. Never smoked. Hx of asthmatic bronchitis once or twice a year but no significant problems.       Review of Systems     Objective:   Physical Exam  Constitutional: She is oriented to person, place, and time. She appears well-developed and well-nourished.  HENT:  Head: Normocephalic and atraumatic.  Right Ear: External ear normal.  Left Ear: External ear normal.  Nose: Nose normal.  Mouth/Throat: Oropharynx is clear and moist. No oropharyngeal exudate.  Eyes: Conjunctivae are normal. Right eye exhibits no discharge. Left eye exhibits no discharge.  Neck: Normal range of motion. Neck supple.  Cardiovascular: Normal rate, regular rhythm and normal heart sounds.   Pulmonary/Chest:  significant bilateral wheezing and rhonchi.   Lymphadenopathy:    She has no cervical adenopathy.  Neurological: She is alert and oriented to person, place, and time.   Skin: Skin is dry.  Psychiatric: She has a normal mood and affect. Her behavior is normal.          Assessment & Plan:  Chronic cough/asthmatic bronchitis/wheezing - pt did not want any more oral steroids. Pulse ox 95 percent. Pt declined spirometry today. She did have positive IGG response to pertussis but IGM was negative. Allergy blood testing was done and negative results with allergens tested.  Gave depo medrol 80mg  IM in office today. Pt is hesitant to start daily inhaler but discussed it appears she does have some type of ongoing lung issues possible COPD despite negative smoking hx. After 5 abx I do not feel like she needs any more abx's. Advair sample was given with rx. Discussed to stay on until she sees pulmonologist. Start singulair in combination with OTC zyrtec. Since cough is most bothersome symptoms tussionex for night and tessalon during the day was given. Discussed with pt I feel cough is coming from lung inflammation and will decrease with daily inhaler. Follow up as needed.   Spent 30 minutes with pt and greater than 50 percent of visit spent counseling pt regarding next steps in treatment of chronic cough.

## 2013-11-13 NOTE — Patient Instructions (Signed)
Will refer to pulmonology. Dr. Rogelia Mireeicht.   Start Advair 1 puff twice a day. Start Singulair daily.

## 2013-11-30 ENCOUNTER — Telehealth: Payer: Self-pay

## 2013-11-30 DIAGNOSIS — B379 Candidiasis, unspecified: Secondary | ICD-10-CM

## 2013-11-30 MED ORDER — FLUCONAZOLE 150 MG PO TABS: 150.0000 mg | ORAL_TABLET | Freq: Once | ORAL | Status: DC

## 2013-11-30 NOTE — Telephone Encounter (Signed)
Medication sent to pharmacy  

## 2013-11-30 NOTE — Telephone Encounter (Signed)
Ok for diflucan 150mg  once number 1 no refills. If not improving needs to be seen.

## 2013-11-30 NOTE — Telephone Encounter (Signed)
Bethany Murillo complains of white vaginal discharge for 4 days. She states she gets yeast infections frequently. Denies fever, chills, sweats or pelvic pain. She would like Diflucan sent to [facility] [address].

## 2013-12-17 ENCOUNTER — Institutional Professional Consult (permissible substitution): Payer: BC Managed Care – PPO | Admitting: Critical Care Medicine

## 2013-12-24 ENCOUNTER — Ambulatory Visit (INDEPENDENT_AMBULATORY_CARE_PROVIDER_SITE_OTHER): Payer: BC Managed Care – PPO | Admitting: Critical Care Medicine

## 2013-12-24 ENCOUNTER — Encounter: Payer: Self-pay | Admitting: Critical Care Medicine

## 2013-12-24 VITALS — BP 126/86 | HR 77 | Temp 98.4°F | Ht 68.0 in | Wt 207.0 lb

## 2013-12-24 DIAGNOSIS — R059 Cough, unspecified: Secondary | ICD-10-CM | POA: Insufficient documentation

## 2013-12-24 DIAGNOSIS — J309 Allergic rhinitis, unspecified: Secondary | ICD-10-CM

## 2013-12-24 DIAGNOSIS — R05 Cough: Secondary | ICD-10-CM | POA: Insufficient documentation

## 2013-12-24 MED ORDER — OMEPRAZOLE 20 MG PO CPDR: 20.0000 mg | DELAYED_RELEASE_CAPSULE | Freq: Every day | ORAL | Status: DC

## 2013-12-24 MED ORDER — FLUTICASONE PROPIONATE 50 MCG/ACT NA SUSP: 2.0000 | Freq: Every day | NASAL | Status: DC

## 2013-12-24 MED ORDER — HYDROCOD POLST-CHLORPHEN POLST 10-8 MG/5ML PO LQCR: 5.0000 mL | Freq: Two times a day (BID) | ORAL | Status: DC | PRN

## 2013-12-24 MED ORDER — CHLORPHENIRAMINE MALEATE 4 MG PO TABS: ORAL_TABLET | ORAL | Status: DC

## 2013-12-24 MED ORDER — PREDNISONE 10 MG PO TABS: ORAL_TABLET | ORAL | Status: DC

## 2013-12-24 MED ORDER — BENZONATATE 200 MG PO CAPS: 200.0000 mg | ORAL_CAPSULE | Freq: Three times a day (TID) | ORAL | Status: DC | PRN

## 2013-12-24 NOTE — Progress Notes (Signed)
Subjective:    Patient ID: Bethany Murillo. Bethany Murillo, female    DOB: 1965/10/17, 48 y.o.   MRN: [id number]  HPI Comments: Chronic cough and dyspnea, started 07/2013.  Min issues before. Dx bronchitis. Rx  Symptoms are better but persists  Cough This is a new problem. The current episode started more than 1 month ago. The problem has been gradually improving. Episode frequency: periodic cough now. The cough is productive of sputum (mucus now white, before was yellow and thick). Associated symptoms include a fever, headaches, hemoptysis, shortness of breath and wheezing. Pertinent negatives include no chest pain, chills, ear congestion, ear pain, heartburn, myalgias, nasal congestion, postnasal drip, rash, rhinorrhea, sore throat, sweats or weight loss. Associated symptoms comments: Notes scratchy throat No real postnasal drip or sneezing.  No sinus pressure. Had hemoptysis in 09/2013 with severe paroxysms Had wheezing now gone. The symptoms are aggravated by cold air, exercise, stress and dust. She has tried a beta-agonist inhaler, oral steroids and prescription cough suppressant (inhalers did not help, steroids/abx helped, rx cough med helped) for the symptoms. The treatment provided moderate relief. Her past medical history is significant for bronchitis. There is no history of asthma, bronchiectasis, COPD, emphysema, environmental allergies or pneumonia. allergy skin test +/- ?dust only    Past Medical History  Diagnosis Date  . Unspecified vitamin D deficiency 01/14/2013  . Cough      Family History  Problem Relation Age of Onset  . Cancer Mother     colon cancer  . Stroke Father   . Cancer Maternal Grandmother      History   Social History  . Marital Status: Married    Spouse Name: N/A    Number of Children: N/A  . Years of Education: N/A   Occupational History  . RN     Hospice   Social History Main Topics  . Smoking status: Never Smoker   . Smokeless tobacco: Never Used  .  Alcohol Use: No  . Drug Use: No  . Sexual Activity: Yes   Other Topics Concern  . Not on file   Social History Narrative  . No narrative on file     No Known Allergies   Outpatient Prescriptions Prior to Visit  Medication Sig Dispense Refill  . benzonatate (TESSALON) 200 MG capsule Take 1 capsule (200 mg total) by mouth 3 (three) times daily as needed for cough.  30 capsule  0  . chlorpheniramine-HYDROcodone (TUSSIONEX) 10-8 MG/5ML LQCR Take 5 mLs by mouth every 12 (twelve) hours as needed for cough (cough, will cause drowsiness.).  120 mL  0  . fluconazole (DIFLUCAN) 150 MG tablet Take 1 tablet (150 mg total) by mouth once.  1 tablet  0  . Fluticasone-Salmeterol (ADVAIR) 100-50 MCG/DOSE AEPB Inhale 1 puff into the lungs 2 (two) times daily.  1 each  2  . montelukast (SINGULAIR) 10 MG tablet Take 1 tablet (10 mg total) by mouth at bedtime.  30 tablet  2   No facility-administered medications prior to visit.      Review of Systems  Constitutional: Positive for fever. Negative for chills, weight loss, diaphoresis, activity change, appetite change, fatigue and unexpected weight change.  HENT: Positive for voice change. Negative for congestion, dental problem, ear discharge, ear pain, facial swelling, hearing loss, mouth sores, nosebleeds, postnasal drip, rhinorrhea, sinus pressure, sneezing, sore throat, tinnitus and trouble swallowing.   Eyes: Negative for photophobia, discharge, itching and visual disturbance.  Respiratory: Positive for cough, hemoptysis, shortness  of breath and wheezing. Negative for apnea, choking, chest tightness and stridor.   Cardiovascular: Negative for chest pain, palpitations and leg swelling.  Gastrointestinal: Negative.  Negative for heartburn, nausea, vomiting, abdominal pain, constipation, blood in stool and abdominal distention.  Genitourinary: Negative for dysuria, urgency, frequency, hematuria, flank pain, decreased urine volume and difficulty  urinating.  Musculoskeletal: Negative for arthralgias, back pain, gait problem, joint swelling, myalgias, neck pain and neck stiffness.  Skin: Negative for color change, pallor and rash.  Allergic/Immunologic: Negative for environmental allergies.  Neurological: Positive for headaches. Negative for dizziness, tremors, seizures, syncope, speech difficulty, weakness, light-headedness and numbness.  Hematological: Negative for adenopathy. Does not bruise/bleed easily.  Psychiatric/Behavioral: Negative for confusion, sleep disturbance and agitation. The patient is not nervous/anxious.        Objective:   Physical Exam Filed Vitals:   12/24/13 1017  BP: 126/86  Pulse: 77  Temp: 98.4 F (36.9 C)  TempSrc: Oral  Height: 5\' 8"  (1.727 m)  Weight: 207 lb (93.895 kg)  SpO2: 98%    Gen: Pleasant, well-nourished, in no distress,  normal affect  ENT: No lesions,  mouth clear,  oropharynx clear, moderate postnasal drip, bilateral nasal inflammation with turbinate edema  Neck: No JVD, no TMG, no carotid bruits  Lungs: No use of accessory muscles, no dullness to percussion, prominent upper airway pseudo-wheeze and raspy cough with upper airway rhonchi  Cardiovascular: RRR, heart sounds normal, no murmur or gallops, no peripheral edema  Abdomen: soft and NT, no HSM,  BS normal  Musculoskeletal: No deformities, no cyanosis or clubbing  Neuro: alert, non focal  Skin: Warm, no lesions or rashes  No results found.  Chest x-ray normal Allergy profile normal with a low IgE level 12/24/2013 Spirometry normal 12/24/2013      Assessment & Plan:   Cough Cyclic cough secondary to upper airway instability with vocal cord dysfunction and postnasal drip syndrome. Suspected significant allergic component however allergy profile is normal with low IgE level Note chest x-ray shows no active process. There is significant upper airway rhonchi and this patient may yet require an airway  examination Spirometry is normal therefore doubt significant lower airway inflammation and in fact the Advair may be aggravating the cough Plan Omeprazole one daily Reflux diet Start flonase two puff daily ea nostril Chlorpheniramine 12mg  at bedtime STop advair/singulair/albuterol Prednisone 10mg  Take 4 for two days three for two days two for two days one for two days Cough protocol with tussionex/benzonatate Need to consider exam of the throat /upper lungs Return 6 weeks     Updated Medication List Outpatient Encounter Prescriptions as of 12/24/2013  Medication Sig  . albuterol (PROVENTIL HFA;VENTOLIN HFA) 108 (90 BASE) MCG/ACT inhaler Inhale 2 puffs into the lungs as needed.  . benzonatate (TESSALON) 200 MG capsule Take 1 capsule (200 mg total) by mouth 3 (three) times daily as needed for cough.  . chlorpheniramine (CHLOR-TRIMETON) 4 MG tablet Take 12mg  at bedtime  . chlorpheniramine-HYDROcodone (TUSSIONEX) 10-8 MG/5ML LQCR Take 5 mLs by mouth every 12 (twelve) hours as needed for cough (cough, will cause drowsiness.).  Marland Kitchen fluticasone (FLONASE) 50 MCG/ACT nasal spray Place 2 sprays into both nostrils daily.  Marland Kitchen omeprazole (PRILOSEC) 20 MG capsule Take 1 capsule (20 mg total) by mouth daily.  . predniSONE (DELTASONE) 10 MG tablet Take 4 for two days three for two days two for two days one for two days  . [DISCONTINUED] benzonatate (TESSALON) 200 MG capsule Take 1 capsule (200 mg total)  by mouth 3 (three) times daily as needed for cough.  . [DISCONTINUED] chlorpheniramine-HYDROcodone (TUSSIONEX) 10-8 MG/5ML LQCR Take 5 mLs by mouth every 12 (twelve) hours as needed for cough (cough, will cause drowsiness.).  . [DISCONTINUED] fluconazole (DIFLUCAN) 150 MG tablet Take 1 tablet (150 mg total) by mouth once.  . [DISCONTINUED] Fluticasone-Salmeterol (ADVAIR) 100-50 MCG/DOSE AEPB Inhale 1 puff into the lungs 2 (two) times daily.  . [DISCONTINUED] montelukast (SINGULAIR) 10 MG tablet Take 1  tablet (10 mg total) by mouth at bedtime.

## 2013-12-24 NOTE — Patient Instructions (Addendum)
Omeprazole one daily Reflux diet Start flonase two puff daily ea nostril Chlorpheniramine 12mg  at bedtime STop advair/singulair/albuterol Prednisone 10mg  Take 4 for two days three for two days two for two days one for two days Cough protocol with tussionex/benzonatate Need to consider exam of the throat /upper lungs Allergy testing lab Return 6 weeks

## 2013-12-25 LAB — ALLERGY FULL PROFILE
Allergen, D pternoyssinus,d7: <0.1 kU/L
Alternaria Alternata: <0.1 kU/L
Aspergillus fumigatus, m3: <0.1 kU/L
Bahia Grass: <0.1 kU/L
Cat Dander: <0.1 kU/L
Common Ragweed: <0.1 kU/L
D. farinae: <0.1 kU/L
Elm IgE: <0.1 kU/L
G009 Red Top: <0.1 kU/L
IGE (IMMUNOGLOBULIN E), SERUM: 10.5 [IU]/mL (ref 0.0–180.0)
Lamb's Quarters: <0.1 kU/L
Oak: <0.1 kU/L
Plantain: <0.1 kU/L
Sycamore Tree: <0.1 kU/L

## 2013-12-25 NOTE — Assessment & Plan Note (Signed)
Cyclic cough secondary to upper airway instability with vocal cord dysfunction and postnasal drip syndrome. Suspected significant allergic component however allergy profile is normal with low IgE level Note chest x-ray shows no active process. There is significant upper airway rhonchi and this patient may yet require an airway examination Spirometry is normal therefore doubt significant lower airway inflammation and in fact the Advair may be aggravating the cough Plan Omeprazole one daily Reflux diet Start flonase two puff daily ea nostril Chlorpheniramine 12mg  at bedtime STop advair/singulair/albuterol Prednisone 10mg  Take 4 for two days three for two days two for two days one for two days Cough protocol with tussionex/benzonatate Need to consider exam of the throat /upper lungs Return 6 weeks

## 2013-12-28 ENCOUNTER — Encounter: Payer: Self-pay | Admitting: Physician Assistant

## 2013-12-28 ENCOUNTER — Ambulatory Visit (INDEPENDENT_AMBULATORY_CARE_PROVIDER_SITE_OTHER): Payer: BC Managed Care – PPO | Admitting: Physician Assistant

## 2013-12-28 VITALS — BP 113/74 | HR 73 | Ht 68.0 in | Wt 207.0 lb

## 2013-12-28 DIAGNOSIS — Z1239 Encounter for other screening for malignant neoplasm of breast: Secondary | ICD-10-CM

## 2013-12-28 DIAGNOSIS — Z13220 Encounter for screening for lipoid disorders: Secondary | ICD-10-CM

## 2013-12-28 DIAGNOSIS — R10814 Left lower quadrant abdominal tenderness: Secondary | ICD-10-CM

## 2013-12-28 DIAGNOSIS — Z131 Encounter for screening for diabetes mellitus: Secondary | ICD-10-CM

## 2013-12-28 DIAGNOSIS — E559 Vitamin D deficiency, unspecified: Secondary | ICD-10-CM

## 2013-12-28 DIAGNOSIS — Z0000 Encounter for general adult medical examination without abnormal findings: Secondary | ICD-10-CM

## 2013-12-28 DIAGNOSIS — Z23 Encounter for immunization: Secondary | ICD-10-CM

## 2013-12-28 DIAGNOSIS — R10813 Right lower quadrant abdominal tenderness: Secondary | ICD-10-CM

## 2013-12-28 DIAGNOSIS — H02849 Edema of unspecified eye, unspecified eyelid: Secondary | ICD-10-CM

## 2013-12-28 LAB — COMPLETE METABOLIC PANEL WITH GFR
ALT: 14 U/L (ref 0–35)
AST: 18 U/L (ref 0–37)
Albumin: 4.4 g/dL (ref 3.5–5.2)
Alkaline Phosphatase: 43 U/L (ref 39–117)
BUN: 10 mg/dL (ref 6–23)
CALCIUM: 9.8 mg/dL (ref 8.4–10.5)
CHLORIDE: 105 meq/L (ref 96–112)
CO2: 26 meq/L (ref 19–32)
CREATININE: 0.93 mg/dL (ref 0.50–1.10)
GFR, Est African American: 84 mL/min
GFR, Est Non African American: 73 mL/min
GLUCOSE: 90 mg/dL (ref 70–99)
Potassium: 4.9 mEq/L (ref 3.5–5.3)
Sodium: 138 mEq/L (ref 135–145)
Total Bilirubin: 0.5 mg/dL (ref 0.2–1.2)
Total Protein: 7.3 g/dL (ref 6.0–8.3)

## 2013-12-28 LAB — LIPID PANEL
Cholesterol: 175 mg/dL (ref 0–200)
HDL: 49 mg/dL (ref >39)
LDL CALC: 105 mg/dL — AB (ref 0–99)
TRIGLYCERIDES: 105 mg/dL (ref <150)
Total CHOL/HDL Ratio: 3.6 Ratio
VLDL: 21 mg/dL (ref 0–40)

## 2013-12-28 LAB — TSH: TSH: 2.916 u[IU]/mL (ref 0.350–4.500)

## 2013-12-28 MED ORDER — IBUPROFEN 800 MG PO TABS: 800.0000 mg | ORAL_TABLET | Freq: Three times a day (TID) | ORAL | Status: DC | PRN

## 2013-12-28 MED ORDER — FLUCONAZOLE 150 MG PO TABS: 150.0000 mg | ORAL_TABLET | Freq: Once | ORAL | Status: DC

## 2013-12-28 NOTE — Patient Instructions (Addendum)
Will order mammogram.   Come back for pap smear.   Keeping You Healthy  Get These Tests 1. Blood Pressure- Have your blood pressure checked once a year by your health care provider.  Normal blood pressure is 120/80. 2. Weight- Have your body mass index (BMI) calculated to screen for obesity.  BMI is measure of body fat based on height and weight.  You can also calculate your own BMI at https://www.west-esparza.com/. 3. Cholesterol- Have your cholesterol checked every 5 years starting at age 51 then yearly starting at age 2. 4. Chlamydia, HIV, and other sexually transmitted diseases- Get screened every year until age 66, then within three months of each new sexual provider. 5. Pap Smear- Every 1-3 years; discuss with your health care provider. 6. Mammogram- Every year starting at age 27  Take these medicines  Calcium with Vitamin D-Your body needs 1200 mg of Calcium each day and [phone] IU of Vitamin D daily.  Your body can only absorb 500 mg of Calcium at a time so Calcium must be taken in 2 or 3 divided doses throughout the day.  Multivitamin with folic acid- Once daily if it is possible for you to become pregnant.  Get these Immunizations  Gardasil-Series of three doses; prevents HPV related illness such as genital warts and cervical cancer.  Menactra-Single dose; prevents meningitis.  Tetanus shot- Every 10 years.  Flu shot-Every year.  Take these steps 1. Do not smoke-Your healthcare provider can help you quit.  For tips on how to quit go to www.smokefree.gov or call 1-800 QUITNOW. 2. Be physically active- Exercise 5 days a week for at least 30 minutes.  If you are not already physically active, start slow and gradually work up to 30 minutes of moderate physical activity.  Examples of moderate activity include walking briskly, dancing, swimming, bicycling, etc. 3. Breast Cancer- A self breast exam every month is important for early detection of breast cancer.  For more information  and instruction on self breast exams, ask your healthcare provider or [place]. 4. Eat a healthy diet- Eat a variety of healthy foods such as fruits, vegetables, whole grains, low fat milk, low fat cheeses, yogurt, lean meats, poultry and fish, beans, nuts, tofu, etc.  For more information go to www. Thenutritionsource.org 5. Drink alcohol in moderation- Limit alcohol intake to one drink or less per day. Never drink and drive. 6. Depression- Your emotional health is as important as your physical health.  If you're feeling down or losing interest in things you normally enjoy please talk to your healthcare provider about being screened for depression. 7. Dental visit- Brush and floss your teeth twice daily; visit your dentist twice a year. 8. Eye doctor- Get an eye exam at least every 2 years. 9. Helmet use- Always wear a helmet when riding a bicycle, motorcycle, rollerblading or skateboarding. 10. Safe sex- If you may be exposed to sexually transmitted infections, use a condom. 11. Seat belts- Seat belts can save your live; always wear one. 12. Smoke/Carbon Monoxide detectors- These detectors need to be installed on the appropriate level of your home. Replace batteries at least once a year. 13. Skin cancer- When out in the sun please cover up and use sunscreen 15 SPF or higher. 14. Violence- If anyone is threatening or hurting you, please tell your healthcare provider.

## 2013-12-29 LAB — VITAMIN D 25 HYDROXY (VIT D DEFICIENCY, FRACTURES): Vit D, 25-Hydroxy: 20 ng/mL — ABNORMAL LOW (ref 30–89)

## 2013-12-30 DIAGNOSIS — H02849 Edema of unspecified eye, unspecified eyelid: Secondary | ICD-10-CM | POA: Insufficient documentation

## 2013-12-30 NOTE — Progress Notes (Signed)
  Subjective:     Bethany Murillo is a 48 y.o. female and is here for a comprehensive physical exam. The patient reports no problems.  History   Social History  . Marital Status: Married    Spouse Name: N/A    Number of Children: N/A  . Years of Education: N/A   Occupational History  . RN     Hospice   Social History Main Topics  . Smoking status: Never Smoker   . Smokeless tobacco: Never Used  . Alcohol Use: No  . Drug Use: No  . Sexual Activity: Yes   Other Topics Concern  . Not on file   Social History Narrative  . No narrative on file   Health Maintenance  Topic Date Due  . Mammogram  02/04/2009  . Influenza Vaccine  02/27/2014  . Pap Smear  01/28/2015  . Tetanus/tdap  12/29/2023    The following portions of the patient's history were reviewed and updated as appropriate: allergies, current medications, past family history, past medical history, past social history, past surgical history and problem list.  Review of Systems A comprehensive review of systems was negative.   Objective:    BP 113/74  Pulse 73  Ht 5\' 8"  (1.727 m)  Wt 207 lb (93.895 kg)  BMI 31.48 kg/m2 General appearance: alert, cooperative and appears stated age Head: Normocephalic, without obvious abnormality, atraumatic Eyes: conjunctivae/corneas clear. PERRL, EOM's intact. Fundi benign., bilateral eyelid edema Ears: normal TM's and external ear canals both ears Nose: Nares normal. Septum midline. Mucosa normal. No drainage or sinus tenderness. Throat: lips, mucosa, and tongue normal; teeth and gums normal Neck: no adenopathy, no carotid bruit, no JVD, supple, symmetrical, trachea midline and thyroid not enlarged, symmetric, no tenderness/mass/nodules Back: symmetric, no curvature. ROM normal. No CVA tenderness. Lungs: clear to auscultation bilaterally Heart: regular rate and rhythm, S1, S2 normal, no murmur, click, rub or gallop Abdomen: soft, no distention, no masses or rebound,  discomfort to palpation over left and right lower abdomen. Extremities: extremities normal, atraumatic, no cyanosis or edema Pulses: 2+ and symmetric Skin: Skin color, texture, turgor normal. No rashes or lesions Lymph nodes: Cervical, supraclavicular, and axillary nodes normal. Neurologic: Grossly normal    Assessment:    Healthy female exam.      Plan:    CPE- Tdap was given without any complications. Needs mammogram will order. Fasting labs were ordered. Discussed regular exercise 150 minutes a week. Calcium and vitamin D daily 1200mg  and 1000units. On cycle today will come back for pap smear.   Eyelid edema- will check TSH. Offered referral. Per pt states she has been and had surgery but will not clear.   Left/right lower abdominal tenderness to palpation only- would like to do a pelvic ultrasound. Pt declined today stating she has had discomfort for years. No pain, no problems with menstrual cycle. Bowel movements are normal without constipation or blood.  Discussed with pt to follow up in 4 weeks and if continues would like to do pelvic ultrasound.   Vitamin D deficeny- will recheck today. Not taking vitamin D.  See After Visit Summary for Counseling Recommendations

## 2014-02-15 ENCOUNTER — Encounter: Payer: Self-pay | Admitting: Critical Care Medicine

## 2014-02-18 ENCOUNTER — Ambulatory Visit: Payer: BC Managed Care – PPO | Admitting: Critical Care Medicine

## 2014-06-29 ENCOUNTER — Ambulatory Visit (INDEPENDENT_AMBULATORY_CARE_PROVIDER_SITE_OTHER): Payer: BC Managed Care – PPO

## 2014-06-29 ENCOUNTER — Telehealth: Payer: Self-pay | Admitting: *Deleted

## 2014-06-29 ENCOUNTER — Other Ambulatory Visit: Payer: Self-pay | Admitting: Physician Assistant

## 2014-06-29 ENCOUNTER — Encounter: Payer: Self-pay | Admitting: Physician Assistant

## 2014-06-29 ENCOUNTER — Ambulatory Visit (INDEPENDENT_AMBULATORY_CARE_PROVIDER_SITE_OTHER): Payer: BC Managed Care – PPO | Admitting: Physician Assistant

## 2014-06-29 VITALS — BP 129/73 | HR 88 | Ht 68.0 in | Wt 207.0 lb

## 2014-06-29 DIAGNOSIS — K760 Fatty (change of) liver, not elsewhere classified: Secondary | ICD-10-CM

## 2014-06-29 DIAGNOSIS — R112 Nausea with vomiting, unspecified: Secondary | ICD-10-CM | POA: Diagnosis not present

## 2014-06-29 DIAGNOSIS — K8000 Calculus of gallbladder with acute cholecystitis without obstruction: Secondary | ICD-10-CM | POA: Insufficient documentation

## 2014-06-29 DIAGNOSIS — R1084 Generalized abdominal pain: Secondary | ICD-10-CM

## 2014-06-29 DIAGNOSIS — K8080 Other cholelithiasis without obstruction: Secondary | ICD-10-CM

## 2014-06-29 LAB — COMPLETE METABOLIC PANEL WITH GFR
ALBUMIN: 4.5 g/dL (ref 3.5–5.2)
ALT: 16 U/L (ref 0–35)
AST: 22 U/L (ref 0–37)
Alkaline Phosphatase: 42 U/L (ref 39–117)
BUN: 9 mg/dL (ref 6–23)
CALCIUM: 10.1 mg/dL (ref 8.4–10.5)
CHLORIDE: 106 meq/L (ref 96–112)
CO2: 26 mEq/L (ref 19–32)
Creat: 0.96 mg/dL (ref 0.50–1.10)
GFR, EST AFRICAN AMERICAN: 80 mL/min
GFR, Est Non African American: 70 mL/min
Glucose, Bld: 95 mg/dL (ref 70–99)
POTASSIUM: 4.6 meq/L (ref 3.5–5.3)
Sodium: 142 mEq/L (ref 135–145)
Total Bilirubin: 0.4 mg/dL (ref 0.2–1.2)
Total Protein: 7.4 g/dL (ref 6.0–8.3)

## 2014-06-29 LAB — CBC WITH DIFFERENTIAL/PLATELET
BASOS PCT: 1 % (ref 0–1)
Basophils Absolute: 0.1 10*3/uL (ref 0.0–0.1)
Eosinophils Absolute: 0.1 10*3/uL (ref 0.0–0.7)
Eosinophils Relative: 1 % (ref 0–5)
HEMATOCRIT: 38.3 % (ref 36.0–46.0)
HEMOGLOBIN: 12.4 g/dL (ref 12.0–15.0)
LYMPHS PCT: 41 % (ref 12–46)
Lymphs Abs: 3.3 10*3/uL (ref 0.7–4.0)
MCH: 27.4 pg (ref 26.0–34.0)
MCHC: 32.3 g/dL (ref 30.0–36.0)
MCV: 84.8 fL (ref 78.0–100.0)
MONOS PCT: 6 % (ref 3–12)
Monocytes Absolute: 0.5 10*3/uL (ref 0.1–1.0)
NEUTROS ABS: 4.1 10*3/uL (ref 1.7–7.7)
NEUTROS PCT: 51 % (ref 43–77)
Platelets: 275 10*3/uL (ref 150–400)
RBC: 4.52 MIL/uL (ref 3.87–5.11)
RDW: 14.9 % (ref 11.5–15.5)
WBC: 8.1 10*3/uL (ref 4.0–10.5)

## 2014-06-29 LAB — LIPASE: Lipase: 27 U/L (ref 0–75)

## 2014-06-29 MED ORDER — IOHEXOL 300 MG/ML  SOLN
100.0000 mL | Freq: Once | INTRAMUSCULAR | Status: AC | PRN
  Administered 2014-06-29: 100 mL via INTRAVENOUS

## 2014-06-29 NOTE — Telephone Encounter (Signed)
No prior auth required for ct abd/pelvis as per [employer] [place]. Corliss SkainsJamie Alister Staver, CMA

## 2014-06-29 NOTE — Progress Notes (Signed)
   Subjective:    Patient ID: Bethany Murillo, female    DOB: 04/18/1966, 48 y.o.   MRN: [id number]  HPI  Pt presents to the clinic with epidoses of nausea, vomiting and abdominal pain. She has had 5 episodes in last year but 3 have been this month. Last one was yesterday and started 3am-11am. She seems to feel better today. Denies any diarrhea or bowel changes. Admits that greasy foods seem to bring about episode. The night before she ate barbeque. She admits she has some indigestion but does not like to take medication to suppress acid. No fever or chills. Her abdomen feels tender today.   Review of Systems  All other systems reviewed and are negative.      Objective:   Physical Exam  Constitutional: She is oriented to person, place, and time. She appears well-developed and well-nourished.  HENT:  Head: Normocephalic and atraumatic.  Cardiovascular: Normal rate, regular rhythm and normal heart sounds.   Pulmonary/Chest: Effort normal and breath sounds normal.  No CVA tenderness.   Abdominal: Soft. Bowel sounds are normal.  Generalized tenderness over entire abdomen. Today seemed to be worse over left lower quadrant.  No guarding or rebound.  No overt tenderness over RUQ.   Neurological: She is alert and oriented to person, place, and time.  Skin: Skin is dry.  Psychiatric: She has a normal mood and affect. Her behavior is normal.          Assessment & Plan:  Generalized abdominal pain/tenderness/nausea and vomiting-  Unclear etiology at this point cannot exclude cholecystitis, pancreatitis, diverticulitis.  EKG NSR at 65, good r wave production, no ST elevation or depression, no arrhthymias.  CT scan ordered, stat. Consider U/S to rule out gallstones or inflammation but since pain was localized to palpation even in lower left quadrant wanted to rule out diverticulitis as well.  Will also get CmP, h.pylori, cbc, lipase.  Discussed to stay away from NSAIDS.

## 2014-06-30 ENCOUNTER — Other Ambulatory Visit: Payer: Self-pay | Admitting: Physician Assistant

## 2014-06-30 LAB — H. PYLORI ANTIBODY, IGG: H Pylori IgG: 6.66 {ISR} — ABNORMAL HIGH

## 2014-06-30 MED ORDER — OMEPRAZOLE 20 MG PO CPDR: 20.0000 mg | DELAYED_RELEASE_CAPSULE | Freq: Two times a day (BID) | ORAL | Status: DC

## 2014-06-30 MED ORDER — CLARITHROMYCIN 500 MG PO TABS: 500.0000 mg | ORAL_TABLET | Freq: Two times a day (BID) | ORAL | Status: DC

## 2014-06-30 MED ORDER — AMOXICILLIN 500 MG PO CAPS: 1000.0000 mg | ORAL_CAPSULE | Freq: Two times a day (BID) | ORAL | Status: DC

## 2014-07-02 ENCOUNTER — Encounter: Payer: Self-pay | Admitting: *Deleted

## 2014-07-02 ENCOUNTER — Other Ambulatory Visit: Payer: Self-pay | Admitting: *Deleted

## 2014-07-02 MED ORDER — FLUCONAZOLE 150 MG PO TABS: ORAL_TABLET | ORAL | Status: DC

## 2014-07-02 MED ORDER — FLUCONAZOLE 150 MG PO TABS: 150.0000 mg | ORAL_TABLET | Freq: Once | ORAL | Status: DC

## 2014-07-03 ENCOUNTER — Encounter: Payer: Self-pay | Admitting: Physician Assistant

## 2014-07-05 ENCOUNTER — Other Ambulatory Visit: Payer: Self-pay | Admitting: Physician Assistant

## 2014-07-05 MED ORDER — FLUCONAZOLE 150 MG PO TABS: ORAL_TABLET | ORAL | Status: DC

## 2014-07-09 ENCOUNTER — Encounter: Payer: Self-pay | Admitting: Physician Assistant

## 2014-07-14 ENCOUNTER — Encounter: Payer: Self-pay | Admitting: Physician Assistant

## 2014-08-02 NOTE — Addendum Note (Signed)
Addended by: Chalmers Cater on: 08/02/2014 05:49 PM   Modules accepted: Orders

## 2014-08-05 ENCOUNTER — Other Ambulatory Visit: Payer: Self-pay | Admitting: Physician Assistant

## 2014-08-10 ENCOUNTER — Other Ambulatory Visit: Payer: Self-pay | Admitting: Physician Assistant

## 2014-08-11 NOTE — Telephone Encounter (Signed)
Medication was discontinued off of med list in May 2015

## 2014-08-17 ENCOUNTER — Other Ambulatory Visit: Payer: Self-pay | Admitting: Physician Assistant

## 2014-08-17 ENCOUNTER — Encounter: Payer: Self-pay | Admitting: Physician Assistant

## 2014-08-17 MED ORDER — MONTELUKAST SODIUM 10 MG PO TABS: 10.0000 mg | ORAL_TABLET | Freq: Every day | ORAL | Status: DC

## 2014-08-17 NOTE — Telephone Encounter (Signed)
Bethany Murillo,  The medication refill the patient is requesting is not on her current medication list. Is refill appropriate? Please advise.

## 2014-08-23 ENCOUNTER — Ambulatory Visit (INDEPENDENT_AMBULATORY_CARE_PROVIDER_SITE_OTHER): Payer: BLUE CROSS/BLUE SHIELD | Admitting: Physician Assistant

## 2014-08-23 ENCOUNTER — Encounter: Payer: Self-pay | Admitting: Physician Assistant

## 2014-08-23 VITALS — BP 110/70 | HR 98 | Wt 194.0 lb

## 2014-08-23 DIAGNOSIS — Z9049 Acquired absence of other specified parts of digestive tract: Secondary | ICD-10-CM

## 2014-08-23 DIAGNOSIS — B9681 Helicobacter pylori [H. pylori] as the cause of diseases classified elsewhere: Secondary | ICD-10-CM

## 2014-08-23 DIAGNOSIS — A048 Other specified bacterial intestinal infections: Secondary | ICD-10-CM

## 2014-08-23 DIAGNOSIS — E559 Vitamin D deficiency, unspecified: Secondary | ICD-10-CM

## 2014-08-23 NOTE — Patient Instructions (Addendum)
Start probiotic to aid with digestion.  Look for digestive enzyme that incorporates lipase, amylase, proteases, and carbohydrases.

## 2014-08-23 NOTE — Addendum Note (Signed)
Addended by: Juel BurrowBENDER, Elvina Bosch L on: 08/23/2014 01:39 PM   Modules accepted: Orders

## 2014-08-23 NOTE — Progress Notes (Signed)
   Subjective:    Patient ID: Bethany Murillo, female    DOB: 11/16/1965, 49 y.o.   MRN: [id number]  HPI  Patient is a 49 year old female who presents to the clinic for follow-up after cholecystectomy. She had her gallbladder removed on 08/03/2014. She has done well after surgery. Her postop appointment was scheduled for Friday canceled. She does not want to keep this appointment. She feels strong and like she is ready to go back to work on her scheduled date of 09/07/2014. She continues to have some on and off diarrhea and constipation. Certainly depends on what she eats. Distal some residual abdominal bloating. Her dry Cough is also started to come back. The only medicine she is on today is Singulair. She did have a previous H. pylori infection which she never checked for resolution.   Review of Systems  All other systems reviewed and are negative.      Objective:   Physical Exam  Constitutional: She is oriented to person, place, and time. She appears well-developed and well-nourished.  HENT:  Head: Normocephalic and atraumatic.  Cardiovascular: Normal rate, regular rhythm and normal heart sounds.   Pulmonary/Chest: Effort normal and breath sounds normal.  Abdominal: Soft. Bowel sounds are normal. She exhibits no distension and no mass. There is tenderness. There is no rebound and no guarding.  Mild diffuse discomfort over upper abdomen. Incision well healed.   Neurological: She is alert and oriented to person, place, and time.  Skin: Skin is dry.  Psychiatric: She has a normal mood and affect. Her behavior is normal.          Assessment & Plan:  H.pylori infection- urea breath test done today for eradication. Will call patient with results. Discussed she can go ahead and go back on Prilosec if she would like to see if her dry cough is coming from reflux.  Post status cholecystectomy- doing well. Reassured patient that incision is well-healed. She seems to be doing. I did give  her 09/07/2014. I still would like for her to not lift anything over 25lbs for another 2 weeks. Discussed that bowel changes after a bladder removal is common. I suggested to go ahead and start a probiotic daily. I also encouraged her to try a multi enzyme supplement. If not improving or symptoms worsening she can certainly follow-up.  History of vitamin D deficiency-last labs reviewed in June and were very low. Patient is not on supplement. We'll check vitamin D today and let her know what milligrams of supplement she needs.

## 2014-08-24 ENCOUNTER — Other Ambulatory Visit: Payer: Self-pay | Admitting: Physician Assistant

## 2014-08-24 LAB — H. PYLORI BREATH TEST: H. pylori Breath Test: DETECTED — AB

## 2014-08-24 LAB — VITAMIN D 25 HYDROXY (VIT D DEFICIENCY, FRACTURES): Vit D, 25-Hydroxy: 16 ng/mL — ABNORMAL LOW (ref 30–100)

## 2014-08-24 MED ORDER — VITAMIN D (ERGOCALCIFEROL) 1.25 MG (50000 UNIT) PO CAPS: 50000.0000 [IU] | ORAL_CAPSULE | ORAL | Status: DC

## 2014-08-26 ENCOUNTER — Encounter: Payer: Self-pay | Admitting: Physician Assistant

## 2014-09-01 ENCOUNTER — Other Ambulatory Visit: Payer: Self-pay | Admitting: Physician Assistant

## 2014-09-01 MED ORDER — OMEPRAZOLE 20 MG PO CPDR: 20.0000 mg | DELAYED_RELEASE_CAPSULE | Freq: Two times a day (BID) | ORAL | Status: DC

## 2014-09-01 MED ORDER — BISMUTH SUBSALICYLATE 262 MG PO TABS: ORAL_TABLET | ORAL | Status: DC

## 2014-09-01 MED ORDER — TETRACYCLINE HCL 500 MG PO CAPS: 500.0000 mg | ORAL_CAPSULE | Freq: Four times a day (QID) | ORAL | Status: DC

## 2014-09-01 MED ORDER — METRONIDAZOLE 500 MG PO TABS: 500.0000 mg | ORAL_TABLET | Freq: Four times a day (QID) | ORAL | Status: DC

## 2014-11-21 ENCOUNTER — Other Ambulatory Visit: Payer: Self-pay | Admitting: Physician Assistant

## 2014-12-06 ENCOUNTER — Encounter: Payer: Self-pay | Admitting: Physician Assistant

## 2014-12-23 ENCOUNTER — Other Ambulatory Visit: Payer: Self-pay | Admitting: Physician Assistant

## 2015-04-15 ENCOUNTER — Ambulatory Visit (HOSPITAL_BASED_OUTPATIENT_CLINIC_OR_DEPARTMENT_OTHER): Payer: BLUE CROSS/BLUE SHIELD

## 2015-04-15 ENCOUNTER — Ambulatory Visit (INDEPENDENT_AMBULATORY_CARE_PROVIDER_SITE_OTHER): Payer: BLUE CROSS/BLUE SHIELD | Admitting: Physician Assistant

## 2015-04-15 ENCOUNTER — Encounter: Payer: Self-pay | Admitting: Physician Assistant

## 2015-04-15 VITALS — BP 127/83 | HR 75 | Ht 68.0 in | Wt 204.0 lb

## 2015-04-15 DIAGNOSIS — R1031 Right lower quadrant pain: Secondary | ICD-10-CM | POA: Diagnosis not present

## 2015-04-15 DIAGNOSIS — M25561 Pain in right knee: Secondary | ICD-10-CM

## 2015-04-15 MED ORDER — IBUPROFEN 800 MG PO TABS: 800.0000 mg | ORAL_TABLET | Freq: Three times a day (TID) | ORAL | Status: DC | PRN

## 2015-04-15 NOTE — Progress Notes (Signed)
   Subjective:    Patient ID: Bethany Murillo. Bethany Murillo, female    DOB: Dec 11, 1965, 49 y.o.   MRN: [id number]  HPI   Patient is a 49 year old female who presents to the clinic with right lower quadrant pain. She cannot deny that this pain could've been ongoing for the past couple months. She feels like she has felt it before however in the last 4 days become much worse. She describes the pain as "gripping" and would rate it 8 out of 10. She denies any pattern to pain. She felt like she could've had a fever the other night but did not check her fever. She just felt a little hot. She denies any nausea, vomiting or appetite changes. She denies any stool changes. If anything she's had a little but of loose stools. She denies any black or tarry stools. She feels like it is happening about 15 times in the last 4 days. The only thing that makes it worse is bending over. She is not taking anything to make it better.  At the end of visit she mentioned some right knee pain that she has presented to the clinic before. She states that there was a shot that was given that make it go away for almost a year. She wonders if she can get that shot today. No new trauma or injury. The pain is localized over the right lateral knee.   Review of Systems  All other systems reviewed and are negative.      Objective:   Physical Exam  Constitutional: She is oriented to person, place, and time. She appears well-developed and well-nourished.  HENT:  Head: Normocephalic and atraumatic.  Cardiovascular: Normal rate, regular rhythm and normal heart sounds.   Pulmonary/Chest: Effort normal and breath sounds normal.  Abdominal: Soft.  Bowel sounds are slightly hypoactive.  There is generalized abdominal tenderness with palpation in all 4 quadrants. In the right lower quadrant there is more intense pain with some rebound tenderness. I did not notice any guarding.  Negative psoas sign.   Musculoskeletal:  No joint line tenderness  in right knee. Negative McMurray's Negative anterior drawer No swelling or bruising noted There is tenderness at fibula head where LCL connects.  Neurological: She is alert and oriented to person, place, and time.  Skin: Skin is dry.  Psychiatric: She has a normal mood and affect. Her behavior is normal.          Assessment & Plan:   Acute right lower quadrant pain-Presentation does not seem like appendicitis or diverticulitis. Do feel like there could be some ovarian etiology with perhaps a cyst. Will get pelvic and transvaginal ultrasound today. Patient does have a history of GI issues. She does not report constipation but encouraged her to take a probiotic and make sure she is having good bowel movements. She could also be having some gas related pain. Follow-up if symptoms worsen. Will get a CBC today to look at white count. If elevated we could consider a CT scan.  Right knee pain-looking back at notes it seems like a steriod injection was given that improved knee pain. At this time I would like to hold off since we do not know the etiology of this right lower quadrant pain. Patient is aware. Encouraged patient to ice and take anti-inflammatory some follow-up as neededIt does seem like there could be a stretch of the LCL that is causing the pain.Marland Kitchen

## 2015-04-16 LAB — CBC WITH DIFFERENTIAL/PLATELET
BASOS PCT: 0 % (ref 0–1)
Basophils Absolute: 0 10*3/uL (ref 0.0–0.1)
EOS ABS: 0.2 10*3/uL (ref 0.0–0.7)
EOS PCT: 3 % (ref 0–5)
HCT: 33.9 % — ABNORMAL LOW (ref 36.0–46.0)
Hemoglobin: 10.9 g/dL — ABNORMAL LOW (ref 12.0–15.0)
Lymphocytes Relative: 39 % (ref 12–46)
Lymphs Abs: 3.2 10*3/uL (ref 0.7–4.0)
MCH: 26.4 pg (ref 26.0–34.0)
MCHC: 32.2 g/dL (ref 30.0–36.0)
MCV: 82.1 fL (ref 78.0–100.0)
MONO ABS: 0.6 10*3/uL (ref 0.1–1.0)
MONOS PCT: 7 % (ref 3–12)
MPV: 11.3 fL (ref 8.6–12.4)
Neutro Abs: 4.2 10*3/uL (ref 1.7–7.7)
Neutrophils Relative %: 51 % (ref 43–77)
Platelets: 300 10*3/uL (ref 150–400)
RBC: 4.13 MIL/uL (ref 3.87–5.11)
RDW: 15.4 % (ref 11.5–15.5)
WBC: 8.3 10*3/uL (ref 4.0–10.5)

## 2015-04-19 ENCOUNTER — Encounter: Payer: Self-pay | Admitting: Physician Assistant

## 2015-06-10 ENCOUNTER — Other Ambulatory Visit: Payer: Self-pay | Admitting: Physician Assistant

## 2015-08-15 ENCOUNTER — Encounter: Payer: Self-pay | Admitting: Physician Assistant

## 2015-08-15 ENCOUNTER — Ambulatory Visit (INDEPENDENT_AMBULATORY_CARE_PROVIDER_SITE_OTHER): Payer: BLUE CROSS/BLUE SHIELD | Admitting: Physician Assistant

## 2015-08-15 ENCOUNTER — Other Ambulatory Visit (HOSPITAL_COMMUNITY)
Admission: RE | Admit: 2015-08-15 | Discharge: 2015-08-15 | Disposition: A | Discharge status: Home / Self Care | Payer: BLUE CROSS/BLUE SHIELD | Source: Ambulatory Visit | Attending: Physician Assistant | Admitting: Physician Assistant

## 2015-08-15 VITALS — BP 121/85 | HR 73 | Ht 68.0 in | Wt 204.0 lb

## 2015-08-15 DIAGNOSIS — R232 Flushing: Secondary | ICD-10-CM

## 2015-08-15 DIAGNOSIS — Z01419 Encounter for gynecological examination (general) (routine) without abnormal findings: Secondary | ICD-10-CM | POA: Diagnosis present

## 2015-08-15 DIAGNOSIS — Z1151 Encounter for screening for human papillomavirus (HPV): Secondary | ICD-10-CM | POA: Insufficient documentation

## 2015-08-15 DIAGNOSIS — N951 Menopausal and female climacteric states: Secondary | ICD-10-CM

## 2015-08-15 DIAGNOSIS — N760 Acute vaginitis: Secondary | ICD-10-CM | POA: Diagnosis present

## 2015-08-15 DIAGNOSIS — Z0000 Encounter for general adult medical examination without abnormal findings: Secondary | ICD-10-CM | POA: Diagnosis not present

## 2015-08-15 DIAGNOSIS — E559 Vitamin D deficiency, unspecified: Secondary | ICD-10-CM | POA: Diagnosis not present

## 2015-08-15 DIAGNOSIS — Z113 Encounter for screening for infections with a predominantly sexual mode of transmission: Secondary | ICD-10-CM | POA: Diagnosis present

## 2015-08-15 DIAGNOSIS — Z131 Encounter for screening for diabetes mellitus: Secondary | ICD-10-CM

## 2015-08-15 DIAGNOSIS — M549 Dorsalgia, unspecified: Secondary | ICD-10-CM

## 2015-08-15 DIAGNOSIS — M546 Pain in thoracic spine: Secondary | ICD-10-CM

## 2015-08-15 DIAGNOSIS — Z1211 Encounter for screening for malignant neoplasm of colon: Secondary | ICD-10-CM

## 2015-08-15 DIAGNOSIS — E669 Obesity, unspecified: Secondary | ICD-10-CM

## 2015-08-15 DIAGNOSIS — E6609 Other obesity due to excess calories: Secondary | ICD-10-CM | POA: Insufficient documentation

## 2015-08-15 DIAGNOSIS — E66811 Obesity, class 1: Secondary | ICD-10-CM | POA: Insufficient documentation

## 2015-08-15 DIAGNOSIS — Z1239 Encounter for other screening for malignant neoplasm of breast: Secondary | ICD-10-CM

## 2015-08-15 DIAGNOSIS — M25561 Pain in right knee: Secondary | ICD-10-CM

## 2015-08-15 DIAGNOSIS — R5383 Other fatigue: Secondary | ICD-10-CM

## 2015-08-15 DIAGNOSIS — Z13220 Encounter for screening for lipoid disorders: Secondary | ICD-10-CM

## 2015-08-15 LAB — COMPLETE METABOLIC PANEL WITH GFR
ALT: 19 U/L (ref 6–29)
AST: 22 U/L (ref 10–35)
Albumin: 4.3 g/dL (ref 3.6–5.1)
Alkaline Phosphatase: 43 U/L (ref 33–130)
BUN: 9 mg/dL (ref 7–25)
CHLORIDE: 104 mmol/L (ref 98–110)
CO2: 25 mmol/L (ref 20–31)
Calcium: 9.4 mg/dL (ref 8.6–10.4)
Creat: 0.9 mg/dL (ref 0.50–1.05)
GFR, EST AFRICAN AMERICAN: 86 mL/min (ref >=60)
GFR, Est Non African American: 75 mL/min (ref >=60)
GLUCOSE: 96 mg/dL (ref 65–99)
POTASSIUM: 4.2 mmol/L (ref 3.5–5.3)
Sodium: 137 mmol/L (ref 135–146)
Total Bilirubin: 0.6 mg/dL (ref 0.2–1.2)
Total Protein: 7.1 g/dL (ref 6.1–8.1)

## 2015-08-15 LAB — LIPID PANEL
Cholesterol: 138 mg/dL (ref 125–200)
HDL: 45 mg/dL — ABNORMAL LOW (ref >=46)
LDL CALC: 73 mg/dL (ref <130)
TRIGLYCERIDES: 98 mg/dL (ref <150)
Total CHOL/HDL Ratio: 3.1 Ratio (ref <=5.0)
VLDL: 20 mg/dL (ref <30)

## 2015-08-15 LAB — TSH: TSH: 2.232 u[IU]/mL (ref 0.350–4.500)

## 2015-08-15 LAB — VITAMIN B12: VITAMIN B 12: 541 pg/mL (ref 211–911)

## 2015-08-15 MED ORDER — DICLOFENAC SODIUM 2 % TD SOLN: 1.0000 "application " | Freq: Two times a day (BID) | TRANSDERMAL | Status: DC

## 2015-08-15 NOTE — Progress Notes (Signed)
Subjective:     Bethany Murillo is a 50 y.o. female and is here for a comprehensive physical exam. The patient reports problems - she is having some more right knee lateral side pain. she has had on and off since 2013 after twisting injury. pain is not constant just when walks long distances. ibuprofen does help. knee gives way a few times a year. she has started having hot flashes. her menstrual cycle has become more sporatic. she wonders how to treat flashes. not tried anything. she has been sleeping on a different pillow and noticed some upper right back pain for last week. no radiation down arm. ibuprofen helps. .  Social History   Social History  . Marital Status: Married    Spouse Name: N/A  . Number of Children: N/A  . Years of Education: N/A   Occupational History  . RN     Hospice   Social History Main Topics  . Smoking status: Never Smoker   . Smokeless tobacco: Never Used  . Alcohol Use: No  . Drug Use: No  . Sexual Activity: Yes   Other Topics Concern  . Not on file   Social History Narrative   Health Maintenance  Topic Date Due  . HIV Screening  05/03/1980  . MAMMOGRAM  02/04/2009  . PAP SMEAR  01/28/2015  . COLONOSCOPY  05/04/2015  . INFLUENZA VACCINE  08/14/2016 (Originally 02/28/2015)  . TETANUS/TDAP  12/29/2023    The following portions of the patient's history were reviewed and updated as appropriate: allergies, current medications, past family history, past medical history, past social history, past surgical history and problem list.  Review of Systems Pertinent items noted in HPI and remainder of comprehensive ROS otherwise negative.   Objective:    BP 121/85 mmHg  Pulse 73  Ht 5\' 8"  (1.727 m)  Wt 204 lb (92.534 kg)  BMI 31.03 kg/m2 General appearance: alert, cooperative, appears stated age and mildly obese Head: Normocephalic, without obvious abnormality, atraumatic Eyes: conjunctivae/corneas clear. PERRL, EOM's intact. Fundi benign. Ears:  normal TM's and external ear canals both ears Nose: Nares normal. Septum midline. Mucosa normal. No drainage or sinus tenderness. Throat: lips, mucosa, and tongue normal; teeth and gums normal Neck: no adenopathy, no carotid bruit, no JVD, supple, symmetrical, trachea midline and thyroid not enlarged, symmetric, no tenderness/mass/nodules NROM with some discomfort with head turning to right. Back: symmetric, no curvature. ROM normal. No CVA tenderness. Lungs: clear to auscultation bilaterally Heart: regular rate and rhythm, S1, S2 normal, no murmur, click, rub or gallop Abdomen: soft, non-tender; bowel sounds normal; no masses,  no organomegaly Pelvic: cervix normal in appearance, external genitalia normal, no adnexal masses or tenderness, no cervical motion tenderness, uterus normal size, shape, and consistency, vagina normal without discharge and around cervical os appears frail. pale mucosa.  Extremities: extremities normal, atraumatic, no cyanosis or edema pain with palpation over insertion of where lateral collateral ligament to fibula.  Pulses: 2+ and symmetric Skin: Skin color, texture, turgor normal. No rashes or lesions Lymph nodes: Cervical, supraclavicular, and axillary nodes normal. Neurologic: Grossly normal    Assessment:    Healthy female exam.      Plan:    CPE- pap done today. Colonoscopy ordered. Mammogram ordered. Fasting labs ordered. Discussed weight loss. Encouraged vitamin D and calcium.   obese- discussed weight loss with diet and exercise. Discussed available medications. Follow up if would like to discuss.   Hot flashes- still having period. Discussed needed one year  period free to be in full menopause. Will check FSH level today. HO on natural ways to control hot flashes given.   Vitamin D def- will recheck today. Currently not taking vitamin D.   Right knee pain- steroid injection helped for years. Ongoing since 4 years ago. Need to consider MRI. Gave pennsaid  and oral Ibuprofen as needed. Knee exercises given. Seems to be more arthritic/inflammatory where LcL attaches to fibula.    Upper right back pain- discussed good supportive pillow, pennsaid as needed, warm compresses, flexeril at bedtime. Consider massage. Follow up if worsening or not improving.  See After Visit Summary for Counseling Recommendations

## 2015-08-15 NOTE — Patient Instructions (Addendum)
Menopause and Herbal Products WHAT IS MENOPAUSE? Menopause is the normal time of life when menstrual periods decrease in frequency and eventually stop completely. This process can take several years for some women. Menopause is complete when you have had an absence of menstruation for a full year since your last menstrual period. It usually occurs between the ages of 48 and 55. It is not common for menopause to begin before the age of 40. During menopause, your body stops producing the female hormones estrogen and progesterone. Common symptoms associated with this loss of hormones (vasomotor symptoms) are:  Hot flashes.  Hot flushes.  Night sweats. Other common symptoms and complications of menopause include:  Decrease in sex drive.  Vaginal dryness and thinning of the walls of the vagina. This can make sex painful.  Dryness of the skin and development of wrinkles.  Headaches.  Tiredness.  Irritability.  Memory problems.  Weight gain.  Bladder infections.  Hair growth on the face and chest.  Inability to reproduce offspring (infertility).  Loss of density in the bones (osteoporosis) increasing your risk for breaks (fractures).  Depression.  Hardening and narrowing of the arteries (atherosclerosis). This increases your risk of heart attack and stroke. WHAT TREATMENT OPTIONS ARE AVAILABLE? There are many treatment choices for menopause symptoms. The most common treatment is hormone replacement therapy. Many alternative therapies for menopause are emerging, including the use of herbal products. These supplements can be found in the form of herbs, teas, oils, tinctures, and pills. Common herbal supplements for menopause are made from plants that contain phytoestrogens. Phytoestrogens are compounds that occur naturally in plants and plant products. They act like estrogen in the body. Foods and herbs that contain phytoestrogens include:  Soy.  Flax seeds.  Red  clover.  Ginseng. WHAT MENOPAUSE SYMPTOMS MAY BE HELPED IF I USE HERBAL PRODUCTS?  Vasomotor symptoms. These may be helped by:  Soy. Some studies show that soy may have a moderate benefit for hot flashes.  Black cohosh. There is limited evidence indicating this may be beneficial for hot flashes.  Symptoms that are related to heart and blood vessel disease. These may be helped by soy. Studies have shown that soy can help to lower cholesterol.  Depression. This may be helped by:  St. John's wort. There is limited evidence that shows this may help mild to moderate depression.  Black cohosh. There is evidence that this may help depression and mood swings.  Osteoporosis. Soy may help to decrease bone loss that is associated with menopause and may prevent osteoporosis. Limited evidence indicates that red clover may offer some bone loss protection as well. Other herbal products that are commonly used during menopause lack enough evidence to support their use as a replacement for conventional menopause therapies. These products include evening primrose, ginseng, and red clover. WHAT ARE THE CASES WHEN HERBAL PRODUCTS SHOULD NOT BE USED DURING MENOPAUSE? Do not use herbal products during menopause without your health care provider's approval if:  You are taking medicine.  You have a preexisting liver condition. ARE THERE ANY RISKS IN MY TAKING HERBAL PRODUCTS DURING MENOPAUSE? If you choose to use herbal products to help with symptoms of menopause, keep in mind that:  Different supplements have different and unmeasured amounts of herbal ingredients.  Herbal products are not regulated the same way that medicines are.  Concentrations of herbs may vary depending on the way they are prepared. For example, the concentration may be different in a pill, tea, oil, and tincture.    Little is known about the risks of using herbal products, particularly the risks of long-term use.  Some herbal  supplements can be harmful when combined with certain medicines. Most commonly reported side effects of herbal products are mild. However, if used improperly, many herbal supplements can cause serious problems. Talk to your health care provider before starting any herbal product. If problems develop, stop taking the supplement and let your health care provider know.   This information is not intended to replace advice given to you by your health care provider. Make sure you discuss any questions you have with your health care provider.   Document Released: 01/02/2008 Document Revised: 08/06/2014 Document Reviewed: 12/29/2013 Elsevier Interactive Patient Education 2016 [employer].  Keeping You Healthy  Get These Tests  Blood Pressure- Have your blood pressure checked by your healthcare provider at least once a year.  Normal blood pressure is 120/80.  Weight- Have your body mass index (BMI) calculated to screen for obesity.  BMI is a measure of body fat based on height and weight.  You can calculate your own BMI at https://www.west-esparza.com/  Cholesterol- Have your cholesterol checked every year.  Diabetes- Have your blood sugar checked every year if you have high blood pressure, high cholesterol, a family history of diabetes or if you are overweight.  Pap Test - Have a pap test every 1 to 5 years if you have been sexually active.  If you are older than 65 and recent pap tests have been normal you may not need additional pap tests.  In addition, if you have had a hysterectomy  for benign disease additional pap tests are not necessary.  Mammogram-Yearly mammograms are essential for early detection of breast cancer  Screening for Colon Cancer- Colonoscopy starting at age 20. Screening may begin sooner depending on your family history and other health conditions.  Follow up colonoscopy as directed by your Gastroenterologist.  Screening for Osteoporosis- Screening begins at age 49 with bone  density scanning, sooner if you are at higher risk for developing Osteoporosis.  Get these medicines  Calcium with Vitamin D- Your body requires 1200-1500 mg of Calcium a day and [phone] IU of Vitamin D a day.  You can only absorb 500 mg of Calcium at a time therefore Calcium must be taken in 2 or 3 separate doses throughout the day.  Hormones- Hormone therapy has been associated with increased risk for certain cancers and heart disease.  Talk to your healthcare provider about if you need relief from menopausal symptoms.  Aspirin- Ask your healthcare provider about taking Aspirin to prevent Heart Disease and Stroke.  Get these Immuniztions  Flu shot- Every fall  Pneumonia shot- Once after the age of 9; if you are younger ask your healthcare provider if you need a pneumonia shot.  Tetanus- Every ten years.  Zostavax- Once after the age of 24 to prevent shingles.  Take these steps  Don't smoke- Your healthcare provider can help you quit. For tips on how to quit, ask your healthcare provider or go to www.smokefree.gov or call 1-800 QUIT-NOW.  Be physically active- Exercise 5 days a week for a minimum of 30 minutes.  If you are not already physically active, start slow and gradually work up to 30 minutes of moderate physical activity.  Try walking, dancing, bike riding, swimming, etc.  Eat a healthy diet- Eat a variety of healthy foods such as fruits, vegetables, whole grains, low fat milk, low fat cheeses, yogurt, lean meats, chicken, fish,  eggs, dried beans, tofu, etc.  For more information go to www.thenutritionsource.org  Dental visit- Brush and floss teeth twice daily; visit your dentist twice a year.  Eye exam- Visit your Optometrist or Ophthalmologist yearly.  Drink alcohol in moderation- Limit alcohol intake to one drink or less a day.  Never drink and drive.  Depression- Your emotional health is as important as your physical health.  If you're feeling down or losing interest  in things you normally enjoy, please talk to your healthcare provider.  Seat Belts- can save your life; always wear one  Smoke/Carbon Monoxide detectors- These detectors need to be installed on the appropriate level of your home.  Replace batteries at least once a year.  Violence- If anyone is threatening or hurting you, please tell your healthcare provider.  Living Will/ Health care power of attorney- Discuss with your healthcare provider and family.  Generic Knee Exercises EXERCISES RANGE OF MOTION (ROM) AND STRETCHING EXERCISES These exercises may help you when beginning to rehabilitate your injury. Your symptoms may resolve with or without further involvement from your physician, physical therapist, or athletic trainer. While completing these exercises, remember:   Restoring tissue flexibility helps normal motion to return to the joints. This allows healthier, less painful movement and activity.  An effective stretch should be held for at least 30 seconds.  A stretch should never be painful. You should only feel a gentle lengthening or release in the stretched tissue. STRETCH - Knee Extension, Prone  Lie on your stomach on a firm surface, such as a bed or countertop. Place your right / left knee and leg just beyond the edge of the surface. You may wish to place a towel under the far end of your right / left thigh for comfort.  Relax your leg muscles and allow gravity to straighten your knee. Your clinician may advise you to add an ankle weight if more resistance is helpful for you.  You should feel a stretch in the back of your right / left knee. Hold this position for __________ seconds. Repeat __________ times. Complete this stretch __________ times per day. * Your physician, physical therapist, or athletic trainer may ask you to add ankle weight to enhance your stretch.  RANGE OF MOTION - Knee Flexion, Active  Lie on your back with both knees straight. (If this causes back  discomfort, bend your opposite knee, placing your foot flat on the floor.)  Slowly slide your heel back toward your buttocks until you feel a gentle stretch in the front of your knee or thigh.  Hold for __________ seconds. Slowly slide your heel back to the starting position. Repeat __________ times. Complete this exercise __________ times per day.  STRETCH - Quadriceps, Prone   Lie on your stomach on a firm surface, such as a bed or padded floor.  Bend your right / left knee and grasp your ankle. If you are unable to reach your ankle or pant leg, use a belt around your foot to lengthen your reach.  Gently pull your heel toward your buttocks. Your knee should not slide out to the side. You should feel a stretch in the front of your thigh and/or knee.  Hold this position for __________ seconds. Repeat __________ times. Complete this stretch __________ times per day.  STRETCH - Hamstrings, Supine   Lie on your back. Loop a belt or towel over the ball of your right / left foot.  Straighten your right / left knee and slowly pull  on the belt to raise your leg. Do not allow the right / left knee to bend. Keep your opposite leg flat on the floor.  Raise the leg until you feel a gentle stretch behind your right / left knee or thigh. Hold this position for __________ seconds. Repeat __________ times. Complete this stretch __________ times per day.  STRENGTHENING EXERCISES These exercises may help you when beginning to rehabilitate your injury. They may resolve your symptoms with or without further involvement from your physician, physical therapist, or athletic trainer. While completing these exercises, remember:   Muscles can gain both the endurance and the strength needed for everyday activities through controlled exercises.  Complete these exercises as instructed by your physician, physical therapist, or athletic trainer. Progress the resistance and repetitions only as guided.  You may  experience muscle soreness or fatigue, but the pain or discomfort you are trying to eliminate should never worsen during these exercises. If this pain does worsen, stop and make certain you are following the directions exactly. If the pain is still present after adjustments, discontinue the exercise until you can discuss the trouble with your clinician. STRENGTH - Quadriceps, Isometrics  Lie on your back with your right / left leg extended and your opposite knee bent.  Gradually tense the muscles in the front of your right / left thigh. You should see either your knee cap slide up toward your hip or increased dimpling just above the knee. This motion will push the back of the knee down toward the floor/mat/bed on which you are lying.  Hold the muscle as tight as you can without increasing your pain for __________ seconds.  Relax the muscles slowly and completely in between each repetition. Repeat __________ times. Complete this exercise __________ times per day.  STRENGTH - Quadriceps, Short Arcs   Lie on your back. Place a __________ inch towel roll under your knee so that the knee slightly bends.  Raise only your lower leg by tightening the muscles in the front of your thigh. Do not allow your thigh to rise.  Hold this position for __________ seconds. Repeat __________ times. Complete this exercise __________ times per day.  OPTIONAL ANKLE WEIGHTS: Begin with ____________________, but DO NOT exceed ____________________. Increase in 1 pound/0.5 kilogram increments.  STRENGTH - Quadriceps, Straight Leg Raises  Quality counts! Watch for signs that the quadriceps muscle is working to insure you are strengthening the correct muscles and not "cheating" by substituting with healthier muscles.  Lay on your back with your right / left leg extended and your opposite knee bent.  Tense the muscles in the front of your right / left thigh. You should see either your knee cap slide up or increased  dimpling just above the knee. Your thigh may even quiver.  Tighten these muscles even more and raise your leg 4 to 6 inches off the floor. Hold for __________ seconds.  Keeping these muscles tense, lower your leg.  Relax the muscles slowly and completely in between each repetition. Repeat __________ times. Complete this exercise __________ times per day.  STRENGTH - Hamstring, Curls  Lay on your stomach with your legs extended. (If you lay on a bed, your feet may hang over the edge.)  Tighten the muscles in the back of your thigh to bend your right / left knee up to 90 degrees. Keep your hips flat on the bed/floor.  Hold this position for __________ seconds.  Slowly lower your leg back to the starting position. Repeat  __________ times. Complete this exercise __________ times per day.  OPTIONAL ANKLE WEIGHTS: Begin with ____________________, but DO NOT exceed ____________________. Increase in 1 pound/0.5 kilogram increments.  STRENGTH - Quadriceps, Squats  Stand in a door frame so that your feet and knees are in line with the frame.  Use your hands for balance, not support, on the frame.  Slowly lower your weight, bending at the hips and knees. Keep your lower legs upright so that they are parallel with the door frame. Squat only within the range that does not increase your knee pain. Never let your hips drop below your knees.  Slowly return upright, pushing with your legs, not pulling with your hands. Repeat __________ times. Complete this exercise __________ times per day.  STRENGTH - Quadriceps, Wall Slides  Follow guidelines for form closely. Increased knee pain often results from poorly placed feet or knees.  Lean against a smooth wall or door and walk your feet out 18-24 inches. Place your feet hip-width apart.  Slowly slide down the wall or door until your knees bend __________ degrees.* Keep your knees over your heels, not your toes, and in line with your hips, not falling to  either side.  Hold for __________ seconds. Stand up to rest for __________ seconds in between each repetition. Repeat __________ times. Complete this exercise __________ times per day. * Your physician, physical therapist, or athletic trainer will alter this angle based on your symptoms and progress.   This information is not intended to replace advice given to you by your health care provider. Make sure you discuss any questions you have with your health care provider.   Document Released: 05/30/2005 Document Revised: 08/06/2014 Document Reviewed: 10/28/2008 Elsevier Interactive Patient Education [employer]2016 Elsevier Inc.

## 2015-08-16 ENCOUNTER — Other Ambulatory Visit: Payer: Self-pay | Admitting: Physician Assistant

## 2015-08-16 ENCOUNTER — Encounter: Payer: Self-pay | Admitting: Physician Assistant

## 2015-08-16 LAB — VITAMIN D 25 HYDROXY (VIT D DEFICIENCY, FRACTURES): Vit D, 25-Hydroxy: 15 ng/mL — ABNORMAL LOW (ref 30–100)

## 2015-08-16 LAB — FOLLICLE STIMULATING HORMONE: FSH: 45.9 m[IU]/mL

## 2015-08-16 MED ORDER — VITAMIN D (ERGOCALCIFEROL) 1.25 MG (50000 UNIT) PO CAPS: ORAL_CAPSULE | ORAL | Status: DC

## 2015-08-17 ENCOUNTER — Ambulatory Visit (INDEPENDENT_AMBULATORY_CARE_PROVIDER_SITE_OTHER): Payer: BLUE CROSS/BLUE SHIELD

## 2015-08-17 DIAGNOSIS — Z1231 Encounter for screening mammogram for malignant neoplasm of breast: Secondary | ICD-10-CM | POA: Diagnosis not present

## 2015-08-17 LAB — CYTOLOGY - PAP

## 2015-08-19 ENCOUNTER — Other Ambulatory Visit: Payer: Self-pay | Admitting: *Deleted

## 2015-08-19 LAB — CERVICOVAGINAL ANCILLARY ONLY
Bacterial vaginitis: POSITIVE — AB
Candida vaginitis: NEGATIVE
HERPES (WINDOWPATH): NEGATIVE

## 2015-08-23 ENCOUNTER — Other Ambulatory Visit: Payer: Self-pay | Admitting: *Deleted

## 2015-08-23 MED ORDER — METRONIDAZOLE 500 MG PO TABS: ORAL_TABLET | ORAL | Status: DC

## 2015-08-30 LAB — HM COLONOSCOPY

## 2015-09-02 ENCOUNTER — Encounter: Payer: Self-pay | Admitting: Physician Assistant

## 2015-09-15 ENCOUNTER — Other Ambulatory Visit: Payer: Self-pay | Admitting: Physician Assistant

## 2015-10-11 ENCOUNTER — Encounter: Payer: Self-pay | Admitting: Physician Assistant

## 2015-10-12 ENCOUNTER — Other Ambulatory Visit: Payer: Self-pay | Admitting: Physician Assistant

## 2015-10-12 ENCOUNTER — Other Ambulatory Visit: Payer: Self-pay | Admitting: *Deleted

## 2015-10-12 MED ORDER — FLUCONAZOLE 150 MG PO TABS: 150.0000 mg | ORAL_TABLET | Freq: Every day | ORAL | Status: DC

## 2015-10-12 MED ORDER — VITAMIN D (ERGOCALCIFEROL) 1.25 MG (50000 UNIT) PO CAPS: ORAL_CAPSULE | ORAL | Status: DC

## 2015-11-02 ENCOUNTER — Other Ambulatory Visit: Payer: Self-pay | Admitting: Physician Assistant

## 2015-12-03 ENCOUNTER — Other Ambulatory Visit: Payer: Self-pay | Admitting: Physician Assistant

## 2016-04-04 ENCOUNTER — Encounter: Payer: Self-pay | Admitting: Osteopathic Medicine

## 2016-04-04 ENCOUNTER — Ambulatory Visit (INDEPENDENT_AMBULATORY_CARE_PROVIDER_SITE_OTHER): Payer: BLUE CROSS/BLUE SHIELD | Admitting: Osteopathic Medicine

## 2016-04-04 VITALS — BP 114/70 | HR 78 | Temp 98.2°F | Resp 16 | Ht 68.0 in | Wt 210.0 lb

## 2016-04-04 DIAGNOSIS — R0981 Nasal congestion: Secondary | ICD-10-CM | POA: Diagnosis not present

## 2016-04-04 DIAGNOSIS — R059 Cough, unspecified: Secondary | ICD-10-CM

## 2016-04-04 DIAGNOSIS — R05 Cough: Secondary | ICD-10-CM | POA: Diagnosis not present

## 2016-04-04 MED ORDER — FLUCONAZOLE 150 MG PO TABS: 150.0000 mg | ORAL_TABLET | Freq: Once | ORAL | 0 refills | Status: DC

## 2016-04-04 MED ORDER — GUAIFENESIN-CODEINE 100-10 MG/5ML PO SOLN: 5.0000 mL | Freq: Four times a day (QID) | ORAL | 0 refills | Status: DC | PRN

## 2016-04-04 MED ORDER — DOXYCYCLINE HYCLATE 100 MG PO TABS: 100.0000 mg | ORAL_TABLET | Freq: Two times a day (BID) | ORAL | 0 refills | Status: DC

## 2016-04-04 NOTE — Patient Instructions (Addendum)
If cough is worse or not going away with treatment, we will need to get a chest xray and consider further testing. Take all antibiotics even if you start to feel better, let us know if any problems with the medication. Take cough suppressant as needed, and try lozenges with benzocaine plus menthol to help cough and sore throat, can also use honey and herbal tea. Plan to come to the office to test for pertussis if you are not significantly better by Monday 04/09/16

## 2016-04-04 NOTE — Progress Notes (Signed)
HPI: Bethany Murillo is a 50 y.o. female  who presents to [facility] today, 04/04/16,  for chief complaint of:  Chief Complaint  Patient presents with  . Laryngitis  . Nasal Congestion     . Context: Hx postnasal drip as told to her by pulmonologist, was given singulair and that has helped - took this for a month and then stopped, this was years ago. Would start this on occasion again but didn't stay with it. Nasal congestion came on suddenly 2 weeks ago, also having cough symptoms.  . Location: sinuses, chest congestion  . Duration: 2 weeks of nasal congestion . Timing: worse at night  . Modifying factors: OTC medications and herbals tried to no help . Assoc signs/symptoms: throwing up a bit from coughing so hard.     Past medical, surgical, social and family history reviewed: Past Medical History:  Diagnosis Date  . Cough   . Unspecified vitamin D deficiency 01/14/2013   Past Surgical History:  Procedure Laterality Date  . BLEPHAROPLASTY    . CESAREAN SECTION    . CHOLECYSTECTOMY     Social History  Substance Use Topics  . Smoking status: Never Smoker  . Smokeless tobacco: Never Used  . Alcohol use No   Family History  Problem Relation Age of Onset  . Cancer Mother     colon cancer  . Stroke Father   . Cancer Maternal Grandmother      Current medication list and allergy/intolerance information reviewed:   Current Outpatient Prescriptions  Medication Sig Dispense Refill  . Diclofenac Sodium 2 % SOLN Place 1 application onto the skin 2 (two) times daily. 1 Bottle 3  . fluconazole (DIFLUCAN) 150 MG tablet Take 1 tablet (150 mg total) by mouth daily. 1 tablet 0  . ibuprofen (ADVIL,MOTRIN) 800 MG tablet TAKE 1 TABLET (800 MG TOTAL) BY MOUTH EVERY 8 (EIGHT) HOURS AS NEEDED. 90 tablet 0  . metroNIDAZOLE (FLAGYL) 500 MG tablet Take 1 tablet twice a day for 7 days. 14 tablet 0  . montelukast (SINGULAIR) 10 MG tablet Take 1 tablet (10  mg total) by mouth at bedtime. 90 tablet 1`  . Vitamin D, Ergocalciferol, (DRISDOL) 50000 units CAPS capsule TAKE 1 CAPSULE BY MOUTH EVERY 7 DAYS 8 capsule 1   No current facility-administered medications for this visit.    No Known Allergies    Review of Systems:  Constitutional:  No  fever, no chills, No recent illness, No unintentional weight changes.(+) malaise  HEENT: No  headache, no vision change, no hearing change, (+)sore throat, (+)sinus pressure  Cardiac: No  chest pain, No  pressure, No palpitations  Respiratory:  No  shortness of breath. (+) Cough  Gastrointestinal: No  abdominal pain, (+)nausea, (+) vomiting x3 posttussive,  No  blood in stool, No  diarrhea, No  constipation   Musculoskeletal: No new myalgia/arthralgia  Skin: No  Rash,   Exam:  BP 114/70 (BP Location: Right Arm, Patient Position: Sitting, Cuff Size: Large)   Pulse 78   Temp 98.2 F (36.8 C) (Oral)   Resp 16   Ht 5\' 8"  (1.727 m)   Wt 210 lb (95.3 kg)   SpO2 100%   BMI 31.93 kg/m   Constitutional: VS see above. General Appearance: alert, well-developed, well-nourished, NAD  Eyes: Normal lids and conjunctive, non-icteric sclera  Ears, Nose, Mouth, Throat: MMM, Normal external inspection ears/nares/mouth/lips/gums. TM normal bilaterally. Pharynx/tonsils no erythema, no exudate. Nasal mucosa normal.  Neck: No masses, trachea midline. No thyroid enlargement. No tenderness/mass appreciated. No lymphadenopathy  Respiratory: Normal respiratory effort. no wheeze, no rhonchi, no rales  Cardiovascular: S1/S2 normal, no murmur, no rub/gallop auscultated. RRR. No lower extremity edema.   Gastrointestinal: Nontender, no masses.    MODIFIED CENTOR CRITERIA (ponts if "yes"): Tonsillar exudate (1): no Tender Ant Cervical LN (1): no Absence of cough (1): no Fever (1): no Age  23-14 (1): no 15-45 (0): no >/= 45 (-1): yes SCORE: -1 TREATMENT: -1,0,1 = SUPPORTIVE CARE    ASSESSMENT/PLAN:    Poor historian, I'm not convinced of bacterial sinusitis or atypical pneumonia other than her duration of symptoms, she would rather not get CXR today, posttussive emesis vs coughing mucus is concerning, but afebrile and clear lungs on exam seem more likely for sinus drainage. Doxy should cover ok for CAP/sinusitis, but would consider azithro if looking more concerning for PNA  If no better, RTC for pertussis check and CXR  Work excuse offered, patient declined  Consider pulm reassessment, ?noncompliance with their recs for singulair as a complicating issue  Cough - Plan: doxycycline (VIBRA-TABS) 100 MG tablet, guaiFENesin-codeine 100-10 MG/5ML syrup  Sinus congestion - Plan: doxycycline (VIBRA-TABS) 100 MG tablet   Patient Instructions  If cough is worse or not going away with treatment, we will need to get a chest xray and consider further testing. Take all antibiotics even if you start to feel better, let us know if any problems with the medication. Take cough suppressant as needed, and try lozenges with benzocaine plus menthol to help cough and sore throat, can also use honey and herbal tea. Plan to come to the office to test for pertussis if you are not significantly better by Monday 04/09/16   Visit summary with medication list and pertinent instructions was printed for patient to review. All questions at time of visit were answered - patient instructed to contact office with any additional concerns. ER/RTC precautions were reviewed with the patient. Follow-up plan: Return if symptoms worsen or fail to improve, and as directe dby your PCP for routine care.   Total time spent 25 minutes, greater than 50% of the visit was counseling and coordinating care for diagnosis of  1. Cough   2. Sinus congestion

## 2016-04-12 ENCOUNTER — Other Ambulatory Visit: Payer: Self-pay | Admitting: Physician Assistant

## 2016-04-13 ENCOUNTER — Encounter: Payer: Self-pay | Admitting: Osteopathic Medicine

## 2016-04-13 ENCOUNTER — Ambulatory Visit (INDEPENDENT_AMBULATORY_CARE_PROVIDER_SITE_OTHER): Payer: BLUE CROSS/BLUE SHIELD | Admitting: Osteopathic Medicine

## 2016-04-13 VITALS — BP 122/83 | HR 85 | Temp 98.1°F | Ht 68.0 in | Wt 208.0 lb

## 2016-04-13 DIAGNOSIS — R05 Cough: Secondary | ICD-10-CM

## 2016-04-13 DIAGNOSIS — R053 Chronic cough: Secondary | ICD-10-CM

## 2016-04-13 MED ORDER — AZITHROMYCIN 250 MG PO TABS: ORAL_TABLET | ORAL | 0 refills | Status: DC

## 2016-04-13 NOTE — Progress Notes (Signed)
HPI: Bethany Murillo is a 50 y.o. female  who [facility], 04/13/16,  for chief complaint of:  Chief Complaint  Patient presents with  . Cough    NOT GETTING BETTER    Recently seen for cough/sinus congestion. Took the antibiotic and also had some prednisone in th house that she took, thinks about 20 mg. Did this 2 nights, by third night was better so stopped the prednisone. Continued the antibiotic and finished this. Was feeling a bit better, coughing at night was better but then has started coughing again. No more post-tussive emesis. Few days ago coughing again. Last night took NyQuil and slept ok. Refilled singulair yesterday, she is picking this up today. Still coughing pretty frequently, deep rattling sound    Past medical, surgical, social and family history reviewed: Past Medical History:  Diagnosis Date  . Cough   . Unspecified vitamin D deficiency 01/14/2013   Past Surgical History:  Procedure Laterality Date  . BLEPHAROPLASTY    . CESAREAN SECTION    . CHOLECYSTECTOMY     Social History  Substance Use Topics  . Smoking status: Never Smoker  . Smokeless tobacco: Never Used  . Alcohol use No   Family History  Problem Relation Age of Onset  . Cancer Mother     colon cancer  . Stroke Father   . Cancer Maternal Grandmother      Current medication list and allergy/intolerance information reviewed:   Current Outpatient Prescriptions  Medication Sig Dispense Refill  . Diclofenac Sodium 2 % SOLN Place 1 application onto the skin 2 (two) times daily. 1 Bottle 3  . guaiFENesin-codeine 100-10 MG/5ML syrup Take 5 mLs by mouth every 6 (six) hours as needed for cough. 118 mL 0  . ibuprofen (ADVIL,MOTRIN) 800 MG tablet TAKE 1 TABLET (800 MG TOTAL) BY MOUTH EVERY 8 (EIGHT) HOURS AS NEEDED. 90 tablet 0  . montelukast (SINGULAIR) 10 MG tablet TAKE 1 TABLET BY MOUTH AT BEDTIME 90 tablet 0  . Vitamin D, Ergocalciferol,  (DRISDOL) 50000 units CAPS capsule TAKE 1 CAPSULE BY MOUTH EVERY 7 DAYS 8 capsule 1   No current facility-administered medications for this visit.    No Known Allergies    Review of Systems:  Constitutional:  No  fever, no chills, No recent illness, No unintentional weight changes. No malaise  HEENT: No  headache, no vision change, no hearing change, No sore throat, (+)sinus pressure  Cardiac: No  chest pain, No  pressure, No palpitations  Respiratory:  No  shortness of breath. (+) Cough  Gastrointestinal: No  abdominal pain, No nausea, (+) vomiting x3 posttussive prior to last visit but this has resolved, No  diarrhea  Musculoskeletal: No new myalgia/arthralgia  Skin: No  Rash,  Exam:  BP 122/83   Pulse 85   Temp 98.1 F (36.7 C) (Oral)   Ht 5\' 8"  (1.727 m)   Wt 208 lb (94.3 kg)   BMI 31.63 kg/m   Constitutional: VS see above. General Appearance: alert, well-developed, well-nourished, NAD  Eyes: Normal lids and conjunctive, non-icteric sclera  Ears, Nose, Mouth, Throat: MMM, Normal external inspection ears/nares/mouth/lips/gums. TM normal bilaterally. Pharynx/tonsils no erythema, no exudate. Nasal mucosa normal.   Neck: No masses, trachea midline. No thyroid enlargement. No tenderness/mass appreciated. No lymphadenopathy  Respiratory: Normal respiratory effort. no wheeze, no rhonchi, no rales  Cardiovascular: S1/S2 normal, no murmur, no rub/gallop auscultated. RRR. No lower extremity edema.   Gastrointestinal: Nontender, no  masses.     ASSESSMENT/PLAN:   Doxy should have covered ok for CAP/sinusitis, atypical/walking PNA or pertussis a consideration so will trial Z-pack though I still suspect possible postviral cough syndrome, pt declines CXR  Consider pulm reassessment, ?noncompliance with their recs for singulair as a complicating issue - pt to restart this medicine  Persistent cough - Plan: azithromycin (ZITHROMAX) 250 MG tablet, Bordetella pertussis PCR,  CANCELED: Bordetella pertussis PCR   Patient Instructions  If cough no better by Monday - will need to do a chest x-ray!    Visit summary with medication list and pertinent instructions was printed for patient to review. All questions at time of visit were answered - patient instructed to contact office with any additional concerns. ER/RTC precautions were reviewed with the patient. Follow-up plan: Return if symptoms worsen or fail to improve.

## 2016-04-13 NOTE — Patient Instructions (Signed)
If cough no better by Monday - will need to do a chest x-ray!

## 2016-04-16 IMAGING — CT CT ABD-PELV W/ CM
3 of 5 series · 15 of 32 positions shown, 19 images · IV contrast (omnipaque)
Comparison: None.

CLINICAL DATA: Abdominal pain with nausea vomiting for 3 days.
Symptoms have occurred 3 times over the past month.

EXAM:
CT ABDOMEN AND PELVIS WITH CONTRAST
TECHNIQUE: Multidetector CT imaging of the abdomen and pelvis was performed
using the standard protocol following bolus administration of
intravenous contrast.
CONTRAST:  100 mL OMNIPAQUE IOHEXOL 300 MG/ML  SOLN

[Series 2: abd/pelvis with · axial · 0.86mm/px · z∈[-383,-143]mm · 3 of 97 slices shown, 7 images]
[im 25/97  soft-tissue]
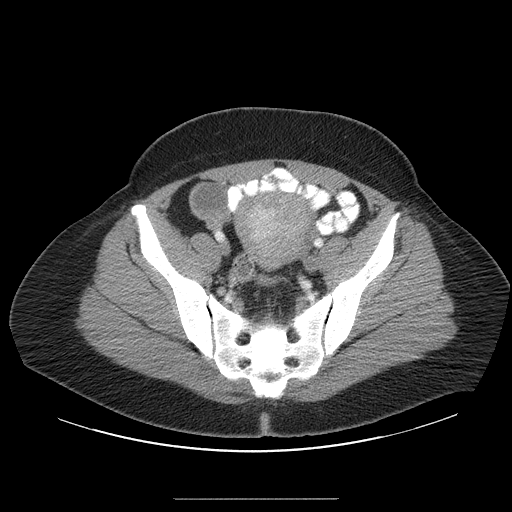
[im 25/97  lung]
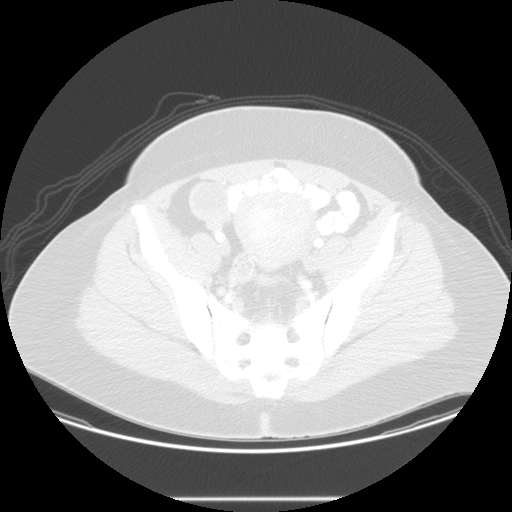
[im 25/97  bone]
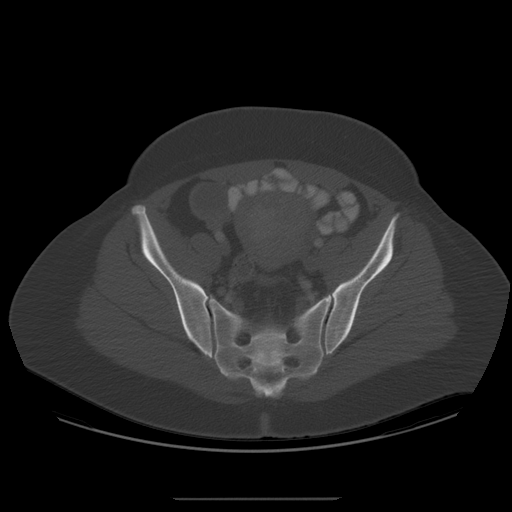
[im 49/97  soft-tissue]
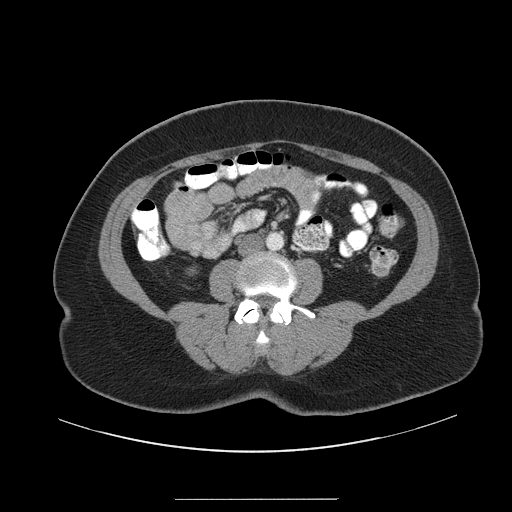
[im 49/97  lung]
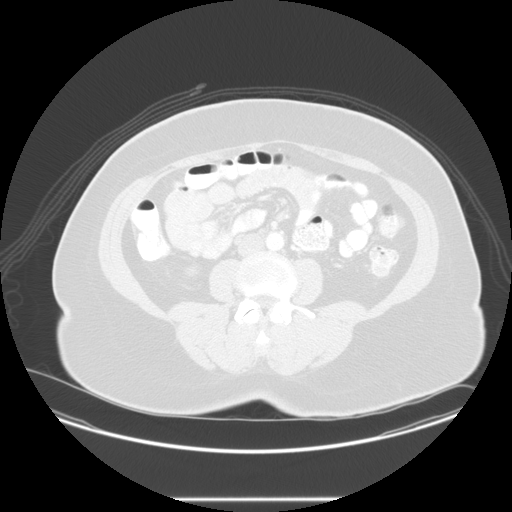
[im 73/97  soft-tissue]
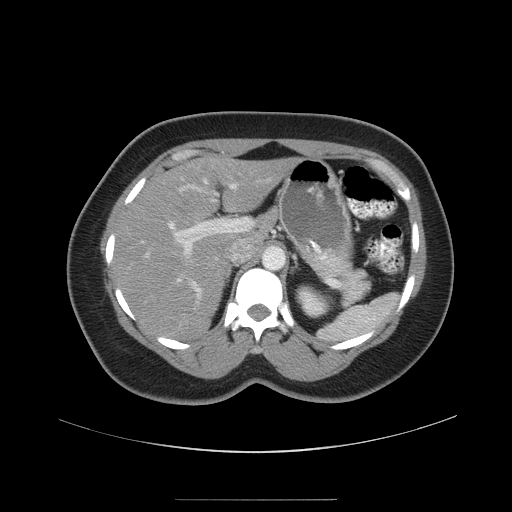
[im 73/97  lung]
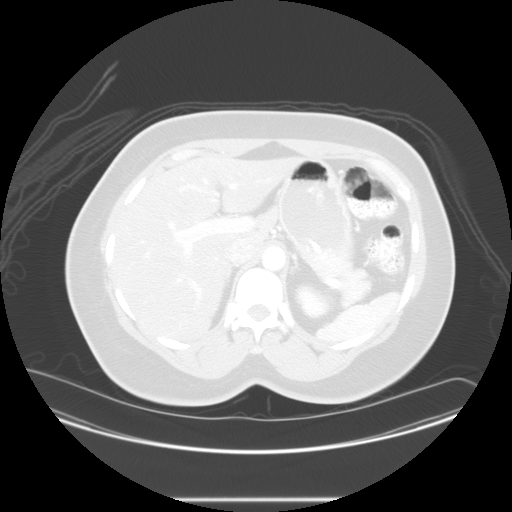

[Series 400: sag · sagittal · 1.06mm/px · 8 of 219 slices shown]
[im 20/219  soft-tissue]
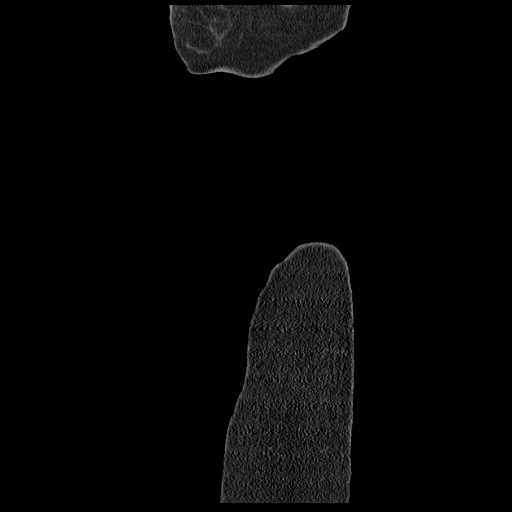
[im 40/219  soft-tissue]
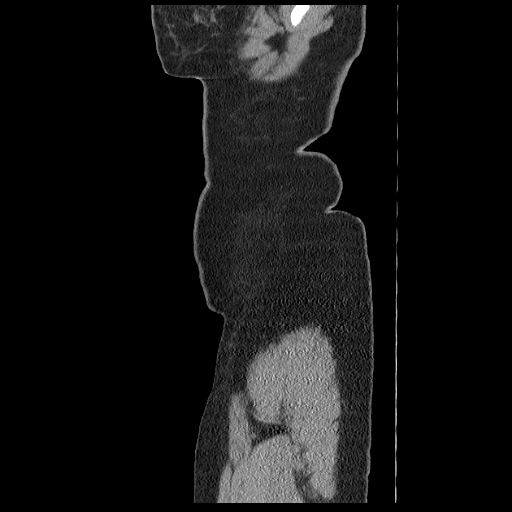
[im 80/219  soft-tissue]
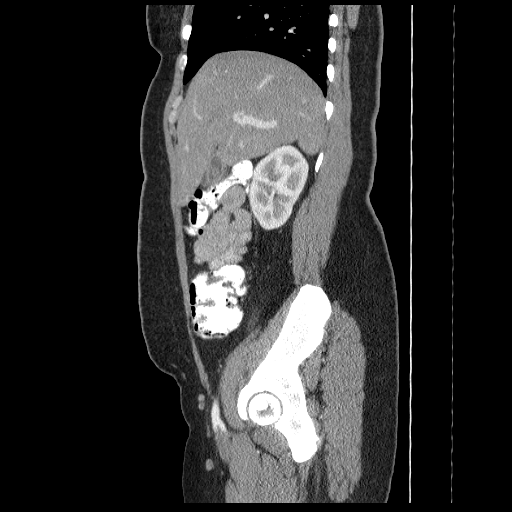
[im 100/219  soft-tissue]
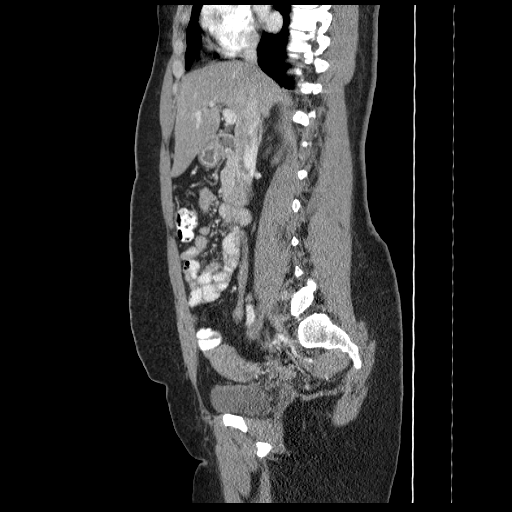
[im 119/219  soft-tissue]
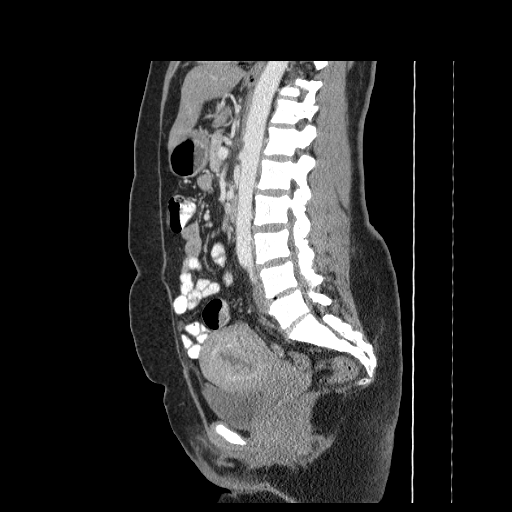
[im 139/219  soft-tissue]
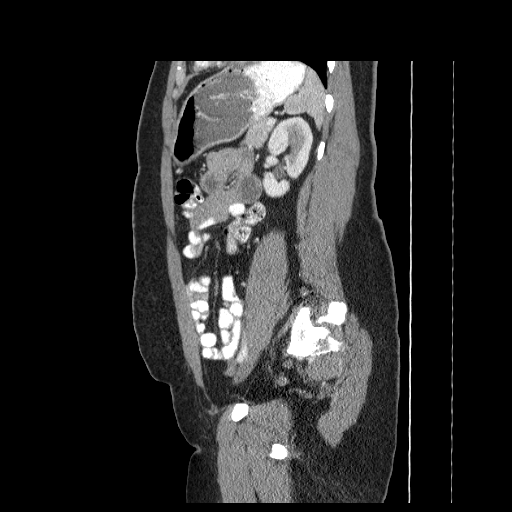
[im 179/219  soft-tissue]
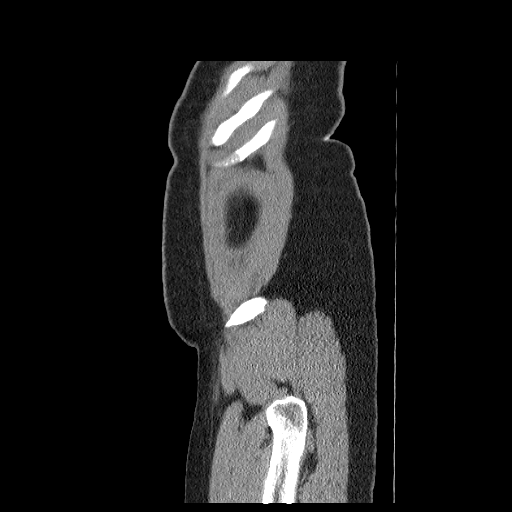
[im 199/219  soft-tissue]
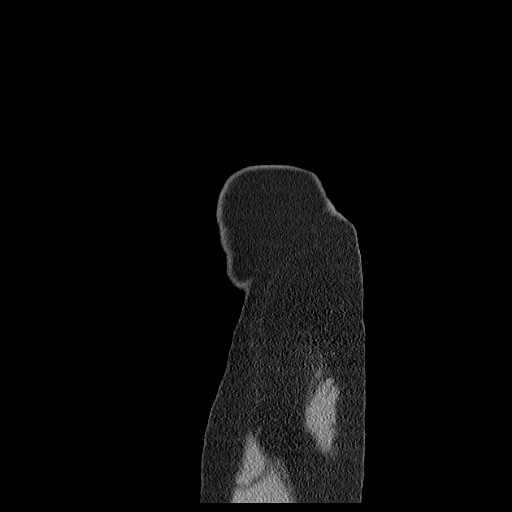

[Series 401: cor · coronal · 1.06mm/px · 4 of 196 slices shown]
[im 20/196  soft-tissue]
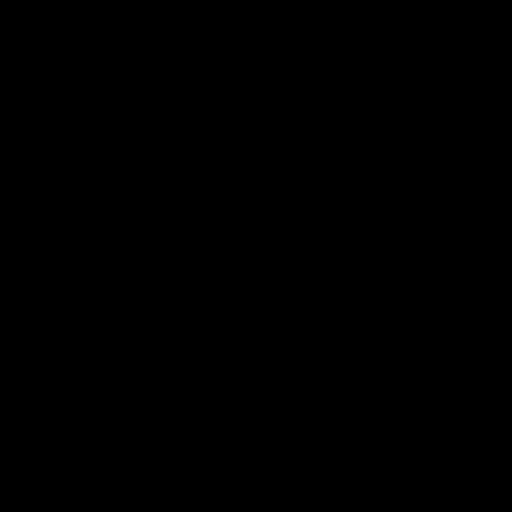
[im 40/196  soft-tissue]
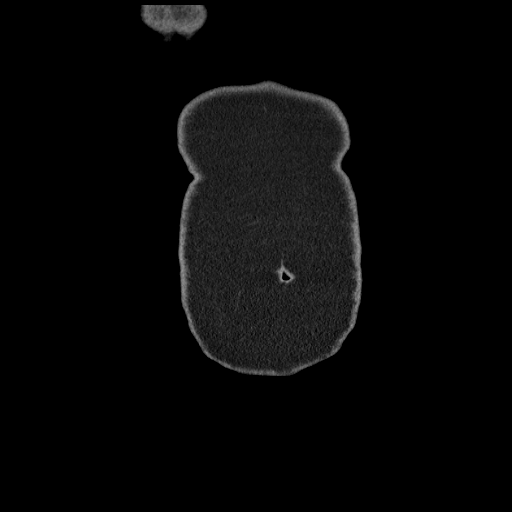
[im 59/196  soft-tissue]
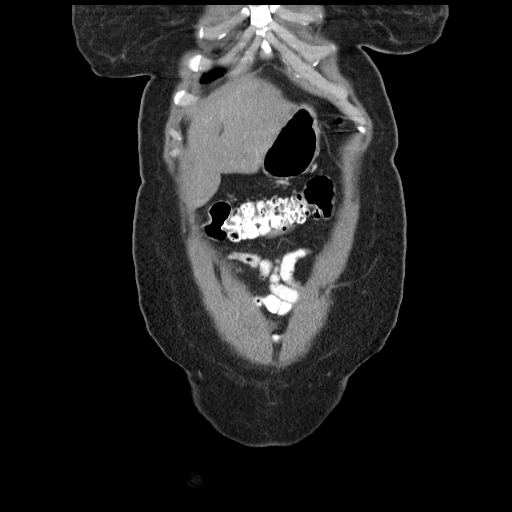
[im 79/196  soft-tissue]
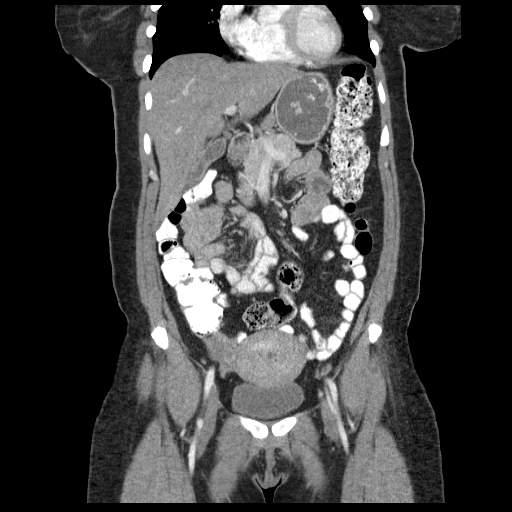

[15 of 32 positions shown; findings below may reference images not displayed]

FINDINGS: Mild dependent atelectasis is present in the lung bases. Heart size
is normal. No pleural or pericardial effusion.

The liver is diffusely low attenuating consistent with fatty
infiltration. Stones are identified within the gallbladder and there
is gallbladder wall thickening. The gallbladder is incompletely
distended. The biliary tree, adrenal glands, spleen, pancreas and
kidneys appear normal.

The stomach and small and large bowel appear normal. The appendix is
not discretely visualized but no evidence of inflammatory process is
seen. There is no lymphadenopathy or fluid. Uterus, adnexa and
urinary bladder are unremarkable.

No lytic or sclerotic bony lesion is identified. Mild lower lumbar
degenerative change is noted.
IMPRESSION: Gallstones and gallbladder wall thickening worrisome for
cholecystitis.

Fatty infiltration of the liver.

These results will be called to the ordering clinician or
representative by the Radiologist Assistant, and communication
documented in the PACS or zVision Dashboard.

## 2016-04-17 LAB — BORDETELLA PERTUSSIS PCR
B parapertussis, DNA: NOT DETECTED
B pertussis, DNA: NOT DETECTED

## 2016-05-22 ENCOUNTER — Encounter: Payer: Self-pay | Admitting: Physician Assistant

## 2016-05-22 ENCOUNTER — Ambulatory Visit (INDEPENDENT_AMBULATORY_CARE_PROVIDER_SITE_OTHER): Payer: BLUE CROSS/BLUE SHIELD | Admitting: Physician Assistant

## 2016-05-22 VITALS — BP 122/78 | HR 64 | Ht 68.0 in | Wt 211.0 lb

## 2016-05-22 DIAGNOSIS — R05 Cough: Secondary | ICD-10-CM | POA: Diagnosis not present

## 2016-05-22 DIAGNOSIS — E559 Vitamin D deficiency, unspecified: Secondary | ICD-10-CM | POA: Diagnosis not present

## 2016-05-22 DIAGNOSIS — Z131 Encounter for screening for diabetes mellitus: Secondary | ICD-10-CM | POA: Diagnosis not present

## 2016-05-22 DIAGNOSIS — R058 Other specified cough: Secondary | ICD-10-CM

## 2016-05-22 DIAGNOSIS — Z0000 Encounter for general adult medical examination without abnormal findings: Secondary | ICD-10-CM

## 2016-05-22 MED ORDER — CHLORPHENIRAMINE MALEATE 4 MG PO TABS: 4.0000 mg | ORAL_TABLET | Freq: Two times a day (BID) | ORAL | 0 refills | Status: DC | PRN

## 2016-05-22 MED ORDER — MONTELUKAST SODIUM 10 MG PO TABS: 10.0000 mg | ORAL_TABLET | Freq: Every day | ORAL | 3 refills | Status: DC

## 2016-05-22 MED ORDER — NAPROXEN 500 MG PO TABS: 500.0000 mg | ORAL_TABLET | Freq: Two times a day (BID) | ORAL | 3 refills | Status: DC

## 2016-05-22 NOTE — Patient Instructions (Signed)

## 2016-05-23 DIAGNOSIS — R058 Other specified cough: Secondary | ICD-10-CM | POA: Insufficient documentation

## 2016-05-23 DIAGNOSIS — R05 Cough: Secondary | ICD-10-CM | POA: Insufficient documentation

## 2016-05-23 LAB — COMPLETE METABOLIC PANEL WITH GFR
ALK PHOS: 45 U/L (ref 33–130)
ALT: 14 U/L (ref 6–29)
AST: 20 U/L (ref 10–35)
Albumin: 4.2 g/dL (ref 3.6–5.1)
BUN: 8 mg/dL (ref 7–25)
CO2: 23 mmol/L (ref 20–31)
Calcium: 9.5 mg/dL (ref 8.6–10.4)
Chloride: 106 mmol/L (ref 98–110)
Creat: 0.95 mg/dL (ref 0.50–1.05)
GFR, EST NON AFRICAN AMERICAN: 70 mL/min (ref >=60)
GFR, Est African American: 80 mL/min (ref >=60)
GLUCOSE: 82 mg/dL (ref 65–99)
POTASSIUM: 4.6 mmol/L (ref 3.5–5.3)
SODIUM: 139 mmol/L (ref 135–146)
Total Bilirubin: 0.5 mg/dL (ref 0.2–1.2)
Total Protein: 7.1 g/dL (ref 6.1–8.1)

## 2016-05-23 LAB — VITAMIN D 25 HYDROXY (VIT D DEFICIENCY, FRACTURES): Vit D, 25-Hydroxy: 15 ng/mL — ABNORMAL LOW (ref 30–100)

## 2016-05-23 NOTE — Progress Notes (Signed)
Subjective:     Bethany Murillo is a 50 y.o. female and is here for a comprehensive physical exam. The patient reports problems - her cough is still present from bronchitis over a month ago. she feels fine but has dry cough. this has happened in previous years. she is taking all of her allergy medications. she would like naproxen filled. she was given this after ankle sprain and realized it helps more than ibuprofen. .  Social History   Social History  . Marital status: Married    Spouse name: N/A  . Number of children: N/A  . Years of education: N/A   Occupational History  . RN Hospice    Hospice   Social History Main Topics  . Smoking status: Never Smoker  . Smokeless tobacco: Never Used  . Alcohol use No  . Drug use: No  . Sexual activity: Yes   Other Topics Concern  . Not on file   Social History Narrative  . No narrative on file   Health Maintenance  Topic Date Due  . HIV Screening  05/03/1980  . INFLUENZA VACCINE  08/14/2016 (Originally 02/28/2016)  . MAMMOGRAM  08/16/2016  . PAP SMEAR  08/14/2018  . TETANUS/TDAP  12/29/2023  . COLONOSCOPY  08/29/2025    The following portions of the patient's history were reviewed and updated as appropriate: allergies, current medications, past family history, past medical history, past social history, past surgical history and problem list.  Review of Systems A comprehensive review of systems was negative.   Objective:    BP 122/78   Pulse 64   Ht 5\' 8"  (1.727 m)   Wt 211 lb (95.7 kg)   BMI 32.08 kg/m  General appearance: alert, cooperative and appears stated age Head: Normocephalic, without obvious abnormality, atraumatic Eyes: conjunctivae/corneas clear. PERRL, EOM's intact. Fundi benign. Ears: normal TM's and external ear canals both ears Nose: Nares normal. Septum midline. Mucosa normal. No drainage or sinus tenderness. Throat: lips, mucosa, and tongue normal; teeth and gums normal Neck: no adenopathy, no carotid  bruit, no JVD, supple, symmetrical, trachea midline and thyroid not enlarged, symmetric, no tenderness/mass/nodules Back: symmetric, no curvature. ROM normal. No CVA tenderness. Lungs: clear to auscultation bilaterally Heart: regular rate and rhythm, S1, S2 normal, no murmur, click, rub or gallop Abdomen: soft, non-tender; bowel sounds normal; no masses,  no organomegaly Extremities: extremities normal, atraumatic, no cyanosis or edema Pulses: 2+ and symmetric Skin: Skin color, texture, turgor normal. No rashes or lesions Lymph nodes: Cervical, supraclavicular, and axillary nodes normal. Neurologic: Alert and oriented X 3, normal strength and tone. Normal symmetric reflexes. Normal coordination and gait    Assessment:    Healthy female exam.      Plan:    Marland KitchenMarland KitchenColton was seen today for annual exam.  Diagnoses and all orders for this visit:  Routine physical examination -     VITAMIN D 25 Hydroxy (Vit-D Deficiency, Fractures) -     COMPLETE METABOLIC PANEL WITH GFR  Vitamin D deficiency -     VITAMIN D 25 Hydroxy (Vit-D Deficiency, Fractures)  Screening for diabetes mellitus -     COMPLETE METABOLIC PANEL WITH GFR  Upper airway cough syndrome -     montelukast (SINGULAIR) 10 MG tablet; Take 1 tablet (10 mg total) by mouth at bedtime. -     chlorpheniramine (CHLOR-TRIMETON) 4 MG tablet; Take 1 tablet (4 mg total) by mouth 2 (two) times daily as needed for allergies. For 7 days.  Other  orders -     naproxen (NAPROSYN) 500 MG tablet; Take 1 tablet (500 mg total) by mouth 2 (two) times daily with a meal.   Discussed weight loss. Encouraged exercise 150 minutes a week and 1500 calorie diet.  Vitamin D and calcium discussed.  See After Visit Summary for Counseling Recommendations

## 2016-05-24 ENCOUNTER — Other Ambulatory Visit: Payer: Self-pay | Admitting: *Deleted

## 2016-05-24 MED ORDER — VITAMIN D (ERGOCALCIFEROL) 1.25 MG (50000 UNIT) PO CAPS: ORAL_CAPSULE | ORAL | 0 refills | Status: DC

## 2016-07-26 ENCOUNTER — Emergency Department
Admission: EM | Admit: 2016-07-26 | Discharge: 2016-07-26 | Disposition: A | Discharge status: Home / Self Care | Payer: BLUE CROSS/BLUE SHIELD | Source: Home / Self Care | Attending: Family Medicine | Admitting: Family Medicine

## 2016-07-26 ENCOUNTER — Encounter: Payer: Self-pay | Admitting: Emergency Medicine

## 2016-07-26 DIAGNOSIS — R110 Nausea: Secondary | ICD-10-CM | POA: Diagnosis not present

## 2016-07-26 DIAGNOSIS — R197 Diarrhea, unspecified: Secondary | ICD-10-CM

## 2016-07-26 LAB — POCT CBC W AUTO DIFF (K'VILLE URGENT CARE)

## 2016-07-26 MED ORDER — ONDANSETRON 4 MG PO TBDP: ORAL_TABLET | ORAL | 0 refills | Status: DC

## 2016-07-26 NOTE — ED Triage Notes (Signed)
Pt c/o hypotension and lightheadedness. Nausea and diarrhea x2 days. She was seen yesterday at [facility] with normal EKG and urine.

## 2016-07-26 NOTE — Discharge Instructions (Signed)
Begin clear liquids (Pedialyte while having diarrhea) until improved, then advance to a BRAT diet (Bananas, Rice, Applesauce, Toast).  Then gradually resume a regular diet when tolerated.  Avoid milk products until well.  When stools become more formed, may take Imodium (loperamide) once or twice daily to decrease stool frequency.  °If symptoms become significantly worse during the night or over the weekend, proceed to the local emergency room.  °

## 2016-07-26 NOTE — ED Provider Notes (Signed)
[facility]    CSN: [id number] Arrival date & time: 07/26/16  1222     History   Chief Complaint Chief Complaint  Patient presents with  . Diarrhea    HPI Bethany Murillo is a 50 y.o. female.    Two nights ago patient developed nausea, watery diarrhea, and fatigue, and had a brief episode of syncope.  No vomiting.  [facility] with normal EKG and urinalysis.  She complains of persistent nausea and loose stools.  She had low grade fever last night, and feels short of breath.  Denies recent foreign travel, or drinking untreated water in a wilderness environment.  She denies recent antibiotic use.    The history is provided by the patient.  Diarrhea  Quality:  Watery Severity:  Moderate Onset quality:  Sudden Duration:  2 days Timing:  Intermittent Progression:  Improving Relieved by:  Nothing Exacerbated by: eating. Ineffective treatments:  None tried Associated symptoms: chills   Associated symptoms: no abdominal pain, no arthralgias, no recent cough, no diaphoresis, no fever, no headaches, no myalgias, no URI and no vomiting   Risk factors: no recent antibiotic use, no sick contacts, no suspicious food intake and no travel to endemic areas     Past Medical History:  Diagnosis Date  . Cough   . Unspecified vitamin D deficiency 01/14/2013    Patient Active Problem List   Diagnosis Date Noted  . Upper airway cough syndrome 05/23/2016  . Obese 08/15/2015  . Right knee pain 08/15/2015  . Gallstones and inflammation of gallbladder without obstruction 06/29/2014  . Eyelid edema 12/30/2013  . Cough 12/24/2013  . Allergic rhinitis 12/24/2013  . Unspecified vitamin D deficiency 01/14/2013    Past Surgical History:  Procedure Laterality Date  . BLEPHAROPLASTY    . CESAREAN SECTION    . CHOLECYSTECTOMY      OB History    No data available       Home Medications    Prior to Admission medications     Medication Sig Start Date End Date Taking? Authorizing Provider  chlorpheniramine (CHLOR-TRIMETON) 4 MG tablet Take 1 tablet (4 mg total) by mouth 2 (two) times daily as needed for allergies. For 7 days. 05/22/16   Jade L Breeback, PA-C  Diclofenac Sodium 2 % SOLN Place 1 application onto the skin 2 (two) times daily. 08/15/15   Jade L Breeback, PA-C  montelukast (SINGULAIR) 10 MG tablet Take 1 tablet (10 mg total) by mouth at bedtime. 05/22/16   Jade L Breeback, PA-C  naproxen (NAPROSYN) 500 MG tablet Take 1 tablet (500 mg total) by mouth 2 (two) times daily with a meal. 05/22/16   Jade L Breeback, PA-C  ondansetron (ZOFRAN ODT) 4 MG disintegrating tablet Take one tab by mouth Q6hr prn nausea.  Dissolve under tongue. 07/26/16   Lattie HawStephen A Mayci Haning, MD  Vitamin D, Ergocalciferol, (DRISDOL) 50000 units CAPS capsule TAKE 1 CAPSULE BY MOUTH EVERY 7 DAYS 05/24/16   Jomarie LongsJade L Breeback, PA-C    Family History Family History  Problem Relation Age of Onset  . Cancer Mother     colon cancer  . Stroke Father   . Cancer Maternal Grandmother     Social History Social History  Substance Use Topics  . Smoking status: Never Smoker  . Smokeless tobacco: Never Used  . Alcohol use No     Allergies   Patient has no known allergies.   Review of Systems Review  of Systems  Constitutional: Positive for chills. Negative for diaphoresis and fever.  Gastrointestinal: Positive for diarrhea. Negative for abdominal pain and vomiting.  Musculoskeletal: Negative for arthralgias and myalgias.  Neurological: Negative for headaches.  All other systems reviewed and are negative.    Physical Exam Triage Vital Signs ED Triage Vitals  Enc Vitals Group     BP 07/26/16 1300 123/80     Pulse Rate 07/26/16 1300 94     Resp --      Temp 07/26/16 1300 97.8 F (36.6 C)     Temp Source 07/26/16 1300 Oral     SpO2 07/26/16 1300 97 %     Weight 07/26/16 1301 207 lb (93.9 kg)     Height --      Head Circumference --       Peak Flow --      Pain Score 07/26/16 1302 0     Pain Loc --      Pain Edu? --      Excl. in GC? --    Orthostatic VS for the past 24 hrs:  BP- Lying Pulse- Lying BP- Sitting Pulse- Sitting BP- Standing at 0 minutes Pulse- Standing at 0 minutes  07/26/16 1414 116/80 85 124/90 88 124/88 91    Updated Vital Signs BP 123/80 (BP Location: Right Arm)   Pulse 94   Temp 97.8 F (36.6 C) (Oral)   Wt 207 lb (93.9 kg)   SpO2 97%   BMI 31.47 kg/m   Visual Acuity Right Eye Distance:   Left Eye Distance:   Bilateral Distance:    Right Eye Near:   Left Eye Near:    Bilateral Near:     Physical Exam Nursing notes and Vital Signs reviewed. Appearance:  Patient appears stated age, and in no acute distress Eyes:  Pupils are equal, round, and reactive to light and accomodation.  Extraocular movement is intact.  Conjunctivae are not inflamed  Ears:  Canals normal.  Tympanic membranes normal.  Nose:  Mildly congested turbinates.  No sinus tenderness.   Pharynx:  Normal; moist mucous membranes  Neck:  Supple.  No adenopathy. Lungs:  Clear to auscultation.  Breath sounds are equal.  Moving air well. Heart:  Regular rate and rhythm without murmurs, rubs, or gallops.  Rate 88 Abdomen:  Nontender without masses or hepatosplenomegaly.  Bowel sounds are present.  No CVA or flank tenderness.  Extremities:  No edema.  Skin:  No rash present.    UC Treatments / Results  Labs (all labs ordered are listed, but only abnormal results are displayed) Labs Reviewed  COMPLETE METABOLIC PANEL WITH GFR  POCT CBC W AUTO DIFF ([facility]):  WBC 7.0; LY 31.1; MO 2.5; GR 66.4; Hgb 13.2; Platelets 264     EKG  EKG Interpretation None       Radiology No results found.  Procedures Procedures (including critical care time)  Medications Ordered in UC Medications - No data to display   Initial Impression / Assessment and Plan / UC Course  I have reviewed the triage vital signs and  the nursing notes.  Pertinent labs & imaging results that were available during my care of the patient were reviewed by me and considered in my medical decision making (see chart for details).  Clinical Course   Normal WBC reassuring.  CMP pending. Suspect viral gastroenteritis. Rx written for Zofran ODT 4mg . Begin clear liquids (Pedialyte while having diarrhea) until improved, then advance to a [address]  diet (Bananas, Rice, Applesauce, Toast).  Then gradually resume a regular diet when tolerated.  Avoid milk products until well.  When stools become more formed, may take Imodium (loperamide) once or twice daily to decrease stool frequency.  If symptoms become significantly worse during the night or over the weekend, proceed to the local emergency room.  Followup with Family Doctor if not improved in 4 days.     Final Clinical Impressions(s) / UC Diagnoses   Final diagnoses:  Nausea  Diarrhea of presumed infectious origin    New Prescriptions New Prescriptions   ONDANSETRON (ZOFRAN ODT) 4 MG DISINTEGRATING TABLET    Take one tab by mouth Q6hr prn nausea.  Dissolve under tongue.     Lattie Haw, MD 08/12/16 [phone]

## 2016-07-27 ENCOUNTER — Telehealth: Payer: Self-pay | Admitting: Emergency Medicine

## 2016-07-27 LAB — COMPLETE METABOLIC PANEL WITH GFR
ALT: 27 U/L (ref 6–29)
AST: 29 U/L (ref 10–35)
Albumin: 4.5 g/dL (ref 3.6–5.1)
Alkaline Phosphatase: 45 U/L (ref 33–130)
BILIRUBIN TOTAL: 0.8 mg/dL (ref 0.2–1.2)
BUN: 11 mg/dL (ref 7–25)
CO2: 25 mmol/L (ref 20–31)
CREATININE: 0.95 mg/dL (ref 0.50–1.05)
Calcium: 9.2 mg/dL (ref 8.6–10.4)
Chloride: 104 mmol/L (ref 98–110)
GFR, EST AFRICAN AMERICAN: 80 mL/min (ref >=60)
GFR, Est Non African American: 70 mL/min (ref >=60)
GLUCOSE: 87 mg/dL (ref 65–99)
Potassium: 4 mmol/L (ref 3.5–5.3)
Sodium: 137 mmol/L (ref 135–146)
TOTAL PROTEIN: 7.6 g/dL (ref 6.1–8.1)

## 2016-08-14 ENCOUNTER — Other Ambulatory Visit: Payer: Self-pay | Admitting: Physician Assistant

## 2016-08-16 ENCOUNTER — Other Ambulatory Visit: Payer: Self-pay | Admitting: Physician Assistant

## 2016-09-04 ENCOUNTER — Telehealth: Payer: Self-pay | Admitting: Physician Assistant

## 2016-09-04 NOTE — Telephone Encounter (Signed)
Lvm about flu shot info-vew

## 2016-09-04 NOTE — Telephone Encounter (Signed)
Called pt lvm on 09/04/16 @ 9:35am to call back

## 2016-09-14 ENCOUNTER — Encounter: Payer: Self-pay | Admitting: Physician Assistant

## 2016-10-09 ENCOUNTER — Other Ambulatory Visit: Payer: Self-pay | Admitting: *Deleted

## 2016-10-09 ENCOUNTER — Other Ambulatory Visit: Payer: Self-pay | Admitting: Osteopathic Medicine

## 2016-10-09 MED ORDER — FLUCONAZOLE 150 MG PO TABS: 150.0000 mg | ORAL_TABLET | Freq: Once | ORAL | 0 refills | Status: AC

## 2016-11-05 ENCOUNTER — Other Ambulatory Visit: Payer: Self-pay | Admitting: Physician Assistant

## 2017-01-25 ENCOUNTER — Other Ambulatory Visit: Payer: Self-pay | Admitting: Physician Assistant

## 2017-04-09 ENCOUNTER — Other Ambulatory Visit: Payer: Self-pay | Admitting: Physician Assistant

## 2018-01-17 ENCOUNTER — Ambulatory Visit (INDEPENDENT_AMBULATORY_CARE_PROVIDER_SITE_OTHER): Payer: Managed Care, Other (non HMO) | Admitting: Physician Assistant

## 2018-01-17 ENCOUNTER — Other Ambulatory Visit: Payer: Self-pay | Admitting: Physician Assistant

## 2018-01-17 ENCOUNTER — Encounter: Payer: Self-pay | Admitting: Physician Assistant

## 2018-01-17 VITALS — BP 126/84 | HR 80 | Wt 192.0 lb

## 2018-01-17 DIAGNOSIS — E559 Vitamin D deficiency, unspecified: Secondary | ICD-10-CM | POA: Diagnosis not present

## 2018-01-17 DIAGNOSIS — Z131 Encounter for screening for diabetes mellitus: Secondary | ICD-10-CM

## 2018-01-17 DIAGNOSIS — R058 Other specified cough: Secondary | ICD-10-CM

## 2018-01-17 DIAGNOSIS — N912 Amenorrhea, unspecified: Secondary | ICD-10-CM | POA: Diagnosis not present

## 2018-01-17 DIAGNOSIS — R05 Cough: Secondary | ICD-10-CM | POA: Diagnosis not present

## 2018-01-17 DIAGNOSIS — N951 Menopausal and female climacteric states: Secondary | ICD-10-CM

## 2018-01-17 DIAGNOSIS — Z13220 Encounter for screening for lipoid disorders: Secondary | ICD-10-CM | POA: Diagnosis not present

## 2018-01-17 DIAGNOSIS — J301 Allergic rhinitis due to pollen: Secondary | ICD-10-CM

## 2018-01-17 DIAGNOSIS — E663 Overweight: Secondary | ICD-10-CM | POA: Insufficient documentation

## 2018-01-17 DIAGNOSIS — Z0000 Encounter for general adult medical examination without abnormal findings: Secondary | ICD-10-CM

## 2018-01-17 MED ORDER — DICLOFENAC SODIUM 1 % TD GEL: 4.0000 g | Freq: Four times a day (QID) | TRANSDERMAL | 5 refills | Status: DC

## 2018-01-17 MED ORDER — DICLOFENAC SODIUM 2 % TD SOLN: 1.0000 "application " | Freq: Two times a day (BID) | TRANSDERMAL | 3 refills | Status: DC

## 2018-01-17 MED ORDER — VITAMIN D (ERGOCALCIFEROL) 1.25 MG (50000 UNIT) PO CAPS: ORAL_CAPSULE | ORAL | 3 refills | Status: DC

## 2018-01-17 MED ORDER — MONTELUKAST SODIUM 10 MG PO TABS: 10.0000 mg | ORAL_TABLET | Freq: Every day | ORAL | 3 refills | Status: DC

## 2018-01-17 MED ORDER — NAPROXEN 500 MG PO TABS: 500.0000 mg | ORAL_TABLET | Freq: Two times a day (BID) | ORAL | 3 refills | Status: DC

## 2018-01-17 MED ORDER — CHLORPHENIRAMINE MALEATE 4 MG PO TABS: 4.0000 mg | ORAL_TABLET | Freq: Two times a day (BID) | ORAL | 11 refills | Status: DC | PRN

## 2018-01-17 MED ORDER — ONDANSETRON 4 MG PO TBDP: ORAL_TABLET | ORAL | 1 refills | Status: DC

## 2018-01-17 NOTE — Patient Instructions (Signed)
Menopause and Herbal Products What is menopause? Menopause is the normal time of life when menstrual periods decrease in frequency and eventually stop completely. This process can take several years for some women. Menopause is complete when you have had an absence of menstruation for a full year since your last menstrual period. It usually occurs between the ages of 48 and 55. It is not common for menopause to begin before the age of 40. During menopause, your body stops producing the female hormones estrogen and progesterone. Common symptoms associated with this loss of hormones (vasomotor symptoms) are:  Hot flashes.  Hot flushes.  Night sweats.  Other common symptoms and complications of menopause include:  Decrease in sex drive.  Vaginal dryness and thinning of the walls of the vagina. This can make sex painful.  Dryness of the skin and development of wrinkles.  Headaches.  Tiredness.  Irritability.  Memory problems.  Weight gain.  Bladder infections.  Hair growth on the face and chest.  Inability to reproduce offspring (infertility).  Loss of density in the bones (osteoporosis) increasing your risk for breaks (fractures).  Depression.  Hardening and narrowing of the arteries (atherosclerosis). This increases your risk of heart attack and stroke.  What treatment options are available? There are many treatment choices for menopause symptoms. The most common treatment is hormone replacement therapy. Many alternative therapies for menopause are emerging, including the use of herbal products. These supplements can be found in the form of herbs, teas, oils, tinctures, and pills. Common herbal supplements for menopause are made from plants that contain phytoestrogens. Phytoestrogens are compounds that occur naturally in plants and plant products. They act like estrogen in the body. Foods and herbs that contain phytoestrogens include:  Soy.  Flax seeds.  Red  clover.  Ginseng.  What menopause symptoms may be helped if I use herbal products?  Vasomotor symptoms. These may be helped by: ? Soy. Some studies show that soy may have a moderate benefit for hot flashes. ? Black cohosh. There is limited evidence indicating this may be beneficial for hot flashes.  Symptoms that are related to heart and blood vessel disease. These may be helped by soy. Studies have shown that soy can help to lower cholesterol.  Depression. This may be helped by: ? St. John's wort. There is limited evidence that shows this may help mild to moderate depression. ? Black cohosh. There is evidence that this may help depression and mood swings.  Osteoporosis. Soy may help to decrease bone loss that is associated with menopause and may prevent osteoporosis. Limited evidence indicates that red clover may offer some bone loss protection as well. Other herbal products that are commonly used during menopause lack enough evidence to support their use as a replacement for conventional menopause therapies. These products include evening primrose, ginseng, and red clover. What are the cases when herbal products should not be used during menopause? Do not use herbal products during menopause without your health care provider's approval if:  You are taking medicine.  You have a preexisting liver condition.  Are there any risks in my taking herbal products during menopause? If you choose to use herbal products to help with symptoms of menopause, keep in mind that:  Different supplements have different and unmeasured amounts of herbal ingredients.  Herbal products are not regulated the same way that medicines are.  Concentrations of herbs may vary depending on the way they are prepared. For example, the concentration may be different in a pill,   tea, oil, and tincture.  Little is known about the risks of using herbal products, particularly the risks of long-term use.  Some herbal  supplements can be harmful when combined with certain medicines.  Most commonly reported side effects of herbal products are mild. However, if used improperly, many herbal supplements can cause serious problems. Talk to your health care provider before starting any herbal product. If problems develop, stop taking the supplement and let your health care provider know. This information is not intended to replace advice given to you by your health care provider. Make sure you discuss any questions you have with your health care provider. Document Released: 01/02/2008 Document Revised: 06/12/2016 Document Reviewed: 12/29/2013 Elsevier Interactive Patient Education  2017 [employer]  

## 2018-01-17 NOTE — Progress Notes (Signed)
Subjective:    Patient ID: Bethany Murillo, female    DOB: 03/14/1966, 52 y.o.   MRN: [id number]  HPI Patient is a 52 yo female with a pmhx of chronic allergies presenting to the clinic today for her yearly exam. She states she is overall doing well but admits to some depression secondary to life stressors - she has started a new business and had a death in the family. She has a history of difficulty sleeping resulting from years working night shift but well managed with melatonin and tylenol pm. Sleep disturbances have increased with onset of menopause and hot flashes. Her last cycle was over a year ago. She has not tried any over the [employer] administrator.   She states she had some weight loss over the past year due to decreased appetite while sick with viral/allergic problems. Her weight has since increased and she would like to get back down to goal weight of 180 lbs. She does not exercise and eats a lots of sugary foods.  She has a history of chronic congestion which is helped with allergy medications. She has run out and would like refills today.   Review of Systems  All other systems reviewed and are negative.  .. Active Ambulatory Problems    Diagnosis Date Noted  . Unspecified vitamin D deficiency 01/14/2013  . Cough 12/24/2013  . Allergic rhinitis 12/24/2013  . Eyelid edema 12/30/2013  . Gallstones and inflammation of gallbladder without obstruction 06/29/2014  . Right knee pain 08/15/2015  . Upper airway cough syndrome 05/23/2016  . Overweight (BMI 25.0-29.9) 01/17/2018   Resolved Ambulatory Problems    Diagnosis Date Noted  . Asthmatic bronchitis 11/13/2013  . Obese 08/15/2015   Past Medical History:  Diagnosis Date  . Cough   . Unspecified vitamin D deficiency 01/14/2013  .Marland Kitchen Family History  Problem Relation Age of Onset  . Cancer Mother        colon cancer  . Stroke Father   . Cancer Maternal Grandmother        Objective:   Physical Exam  Constitutional:  She is oriented to person, place, and time. She appears well-developed and well-nourished.  HENT:  Head: Normocephalic and atraumatic.  Right Ear: Tympanic membrane, external ear and ear canal normal.  Left Ear: Tympanic membrane, external ear and ear canal normal.  Eyes: Pupils are equal, round, and reactive to light.  Neck: Normal range of motion. No thyromegaly present.  Cardiovascular: Normal rate, regular rhythm, normal heart sounds and intact distal pulses.  Pulmonary/Chest: Effort normal and breath sounds normal.  Abdominal: Soft. There is no tenderness.  Neurological: She is alert and oriented to person, place, and time.  Skin: Skin is warm and dry.  Psychiatric: She has a normal mood and affect. Her behavior is normal.  Vitals reviewed.  .. Vitals:   01/17/18 1327  BP: 126/84  Pulse: 80  SpO2: 98%       Assessment & Plan:  .Marland Kitchen Depression screen [facility] 2/9 01/17/2018  Decreased Interest 0  Down, Depressed, Hopeless 1  PHQ - 2 Score 1  Altered sleeping 1  Tired, decreased energy 3  Change in appetite 2  Feeling bad or failure about yourself  0  Trouble concentrating 0  Moving slowly or fidgety/restless 0  PHQ-9 Score 7  Difficult doing work/chores Somewhat difficult   .Marland Kitchen GAD 7 : Generalized Anxiety Score 01/17/2018  Nervous, Anxious, on Edge 0  Control/stop worrying 0  Worry too much - different  things 1  Trouble relaxing 1  Restless 0  Easily annoyed or irritable 1  Anxiety Difficulty Not difficult at all    .Marland Kitchen.Bethany Landngela was seen today for follow-up.  Diagnoses and all orders for this visit:  Overweight (BMI 25.0-29.9)  Upper airway cough syndrome -     montelukast (SINGULAIR) 10 MG tablet; Take 1 tablet (10 mg total) by mouth at bedtime. -     chlorpheniramine (CHLOR-TRIMETON) 4 MG tablet; Take 1 tablet (4 mg total) by mouth 2 (two) times daily as needed for allergies. For 7 days.  Seasonal allergic rhinitis due to pollen  Preventative health care -      COMPLETE METABOLIC PANEL WITH GFR -     CBC -     VITAMIN D 25 Hydroxy (Vit-D Deficiency, Fractures) -     Follicle stimulating hormone -     TSH -     Lipid panel  Screening for diabetes mellitus -     COMPLETE METABOLIC PANEL WITH GFR  Vitamin D deficiency -     Vitamin D, Ergocalciferol, (DRISDOL) 50000 units CAPS capsule; TAKE 1 CAPSULE BY MOUTH EVERY 7 DAYS -     VITAMIN D 25 Hydroxy (Vit-D Deficiency, Fractures)  Menopausal symptom -     Follicle stimulating hormone -     TSH  Amenorrhea  Screening for lipid disorders -     Lipid panel  Other orders -     ondansetron (ZOFRAN ODT) 4 MG disintegrating tablet; Take one tab by mouth Q6hr prn nausea.  Dissolve under tongue. -     naproxen (NAPROSYN) 500 MG tablet; Take 1 tablet (500 mg total) by mouth 2 (two) times daily with a meal. -     Diclofenac Sodium 2 % SOLN; Place 1 application onto the skin 2 (two) times daily.   Menopause - Will get FSH level. Discussed trying some over the counter and herbal options - HO provided. Also discussed potential hormone therapy including risks and benefits. Risks discussed included but not limited to breast cancer, stroke, DVT. She will try OTC therapy and look into HRT for the future.  Weight loss - She has had success with lifestyle modification in the past. Given her BMI today, discussed medications are not a good option for her. Discussed increased exercise and decreasing sweets.  Sleep - She has had chronic sleep issues but is able to manage with OTC measures. Management of menopause vasomotor symptoms will hopefully help with new sleep disturbances.  Depression symptoms - Discussed continued coping measures, including exercise, to manage symptoms and life stressors.  Screening labs today. Declined mammogram. Pt is aware that I would require a mammogram yearly to start HRT.  Declined shingles vaccine.

## 2018-01-19 ENCOUNTER — Encounter: Payer: Self-pay | Admitting: Physician Assistant

## 2018-01-22 LAB — COMPREHENSIVE METABOLIC PANEL
A/G RATIO: 1.8 (ref 1.2–2.2)
ALBUMIN: 4.9 g/dL (ref 3.5–5.5)
ALT: 25 IU/L (ref 0–32)
AST: 26 IU/L (ref 0–40)
Alkaline Phosphatase: 61 IU/L (ref 39–117)
BUN / CREAT RATIO: 9 (ref 9–23)
BUN: 9 mg/dL (ref 6–24)
Bilirubin Total: 0.4 mg/dL (ref 0.0–1.2)
CO2: 25 mmol/L (ref 20–29)
Calcium: 10.1 mg/dL (ref 8.7–10.2)
Chloride: 103 mmol/L (ref 96–106)
Creatinine, Ser: 0.98 mg/dL (ref 0.57–1.00)
GFR, EST AFRICAN AMERICAN: 77 mL/min/{1.73_m2} (ref >59)
GFR, EST NON AFRICAN AMERICAN: 67 mL/min/{1.73_m2} (ref >59)
Globulin, Total: 2.7 g/dL (ref 1.5–4.5)
Glucose: 89 mg/dL (ref 65–99)
POTASSIUM: 5 mmol/L (ref 3.5–5.2)
Sodium: 144 mmol/L (ref 134–144)
TOTAL PROTEIN: 7.6 g/dL (ref 6.0–8.5)

## 2018-01-22 LAB — LIPID PANEL
Chol/HDL Ratio: 3.2 ratio (ref 0.0–4.4)
Cholesterol, Total: 137 mg/dL (ref 100–199)
HDL: 43 mg/dL (ref >39)
LDL Calculated: 81 mg/dL (ref 0–99)
Triglycerides: 63 mg/dL (ref 0–149)
VLDL Cholesterol Cal: 13 mg/dL (ref 5–40)

## 2018-01-22 LAB — CBC
Hematocrit: 37.2 % (ref 34.0–46.6)
Hemoglobin: 12.5 g/dL (ref 11.1–15.9)
MCH: 28.1 pg (ref 26.6–33.0)
MCHC: 33.6 g/dL (ref 31.5–35.7)
MCV: 84 fL (ref 79–97)
PLATELETS: 265 10*3/uL (ref 150–450)
RBC: 4.45 x10E6/uL (ref 3.77–5.28)
RDW: 14.6 % (ref 12.3–15.4)
WBC: 6.9 10*3/uL (ref 3.4–10.8)

## 2018-01-22 LAB — TSH: TSH: 1.61 u[IU]/mL (ref 0.450–4.500)

## 2018-01-22 LAB — FOLLICLE STIMULATING HORMONE: FSH: 52 m[IU]/mL

## 2018-01-22 NOTE — Progress Notes (Signed)
Call pt: Hgb better. Cholesterol great.  Thyroid great. Kidney and liver great. Confirmed menopause.

## 2019-08-24 ENCOUNTER — Ambulatory Visit (INDEPENDENT_AMBULATORY_CARE_PROVIDER_SITE_OTHER): Payer: BC Managed Care – PPO | Admitting: Physician Assistant

## 2019-08-24 ENCOUNTER — Other Ambulatory Visit: Payer: Self-pay

## 2019-08-24 ENCOUNTER — Encounter: Payer: Self-pay | Admitting: Physician Assistant

## 2019-08-24 VITALS — BP 128/80 | HR 76 | Ht 68.0 in | Wt 214.0 lb

## 2019-08-24 DIAGNOSIS — Z131 Encounter for screening for diabetes mellitus: Secondary | ICD-10-CM | POA: Diagnosis not present

## 2019-08-24 DIAGNOSIS — Z0000 Encounter for general adult medical examination without abnormal findings: Secondary | ICD-10-CM

## 2019-08-24 DIAGNOSIS — Z13220 Encounter for screening for lipoid disorders: Secondary | ICD-10-CM

## 2019-08-24 DIAGNOSIS — J310 Chronic rhinitis: Secondary | ICD-10-CM

## 2019-08-24 DIAGNOSIS — R05 Cough: Secondary | ICD-10-CM

## 2019-08-24 DIAGNOSIS — R058 Other specified cough: Secondary | ICD-10-CM

## 2019-08-24 DIAGNOSIS — Z1329 Encounter for screening for other suspected endocrine disorder: Secondary | ICD-10-CM

## 2019-08-24 DIAGNOSIS — E6609 Other obesity due to excess calories: Secondary | ICD-10-CM

## 2019-08-24 DIAGNOSIS — J301 Allergic rhinitis due to pollen: Secondary | ICD-10-CM

## 2019-08-24 DIAGNOSIS — E559 Vitamin D deficiency, unspecified: Secondary | ICD-10-CM

## 2019-08-24 DIAGNOSIS — R7309 Other abnormal glucose: Secondary | ICD-10-CM

## 2019-08-24 DIAGNOSIS — N951 Menopausal and female climacteric states: Secondary | ICD-10-CM

## 2019-08-24 DIAGNOSIS — Z1231 Encounter for screening mammogram for malignant neoplasm of breast: Secondary | ICD-10-CM

## 2019-08-24 DIAGNOSIS — Z6832 Body mass index (BMI) 32.0-32.9, adult: Secondary | ICD-10-CM

## 2019-08-24 MED ORDER — VITAMIN D (ERGOCALCIFEROL) 1.25 MG (50000 UNIT) PO CAPS
ORAL_CAPSULE | ORAL | 3 refills | Status: DC
Start: 2019-08-24 — End: 2020-07-21

## 2019-08-24 MED ORDER — MONTELUKAST SODIUM 10 MG PO TABS: 10.0000 mg | ORAL_TABLET | Freq: Every day | ORAL | 3 refills | Status: DC

## 2019-08-24 MED ORDER — NAPROXEN 500 MG PO TABS: 500.0000 mg | ORAL_TABLET | Freq: Two times a day (BID) | ORAL | 3 refills | Status: DC

## 2019-08-24 NOTE — Patient Instructions (Addendum)
Menopause Menopause is the normal time of life when menstrual periods stop completely. It is usually confirmed by 12 months without a menstrual period. The transition to menopause (perimenopause) most often happens between the ages of 45 and 55. During perimenopause, hormone levels change in your body, which can cause symptoms and affect your health. Menopause may increase your risk for:  Loss of bone (osteoporosis), which causes bone breaks (fractures).  Depression.  Hardening and narrowing of the arteries (atherosclerosis), which can cause heart attacks and strokes. What are the causes? This condition is usually caused by a natural change in hormone levels that happens as you get older. The condition may also be caused by surgery to remove both ovaries (bilateral oophorectomy). What increases the risk? This condition is more likely to start at an earlier age if you have certain medical conditions or treatments, including:  A tumor of the pituitary gland in the brain.  A disease that affects the ovaries and hormone production.  Radiation treatment for cancer.  Certain cancer treatments, such as chemotherapy or hormone (anti-estrogen) therapy.  Heavy smoking and excessive alcohol use.  Family history of early menopause. This condition is also more likely to develop earlier in women who are very thin. What are the signs or symptoms? Symptoms of this condition include:  Hot flashes.  Irregular menstrual periods.  Night sweats.  Changes in feelings about sex. This could be a decrease in sex drive or an increased comfort around your sexuality.  Vaginal dryness and thinning of the vaginal walls. This may cause painful intercourse.  Dryness of the skin and development of wrinkles.  Headaches.  Problems sleeping (insomnia).  Mood swings or irritability.  Memory problems.  Weight gain.  Hair growth on the face and chest.  Bladder infections or problems with urinating. How  is this diagnosed? This condition is diagnosed based on your medical history, a physical exam, your age, your menstrual history, and your symptoms. Hormone tests may also be done. How is this treated? In some cases, no treatment is needed. You and your health care provider should make a decision together about whether treatment is necessary. Treatment will be based on your individual condition and preferences. Treatment for this condition focuses on managing symptoms. Treatment may include:  Menopausal hormone therapy (MHT).  Medicines to treat specific symptoms or complications.  Acupuncture.  Vitamin or herbal supplements. Before starting treatment, make sure to let your health care provider know if you have a personal or family history of:  Heart disease.  Breast cancer.  Blood clots.  Diabetes.  Osteoporosis. Follow these instructions at home: Lifestyle  Do not use any products that contain nicotine or tobacco, such as cigarettes and e-cigarettes. If you need help quitting, ask your health care provider.  Get at least 30 minutes of physical activity on 5 or more days each week.  Avoid alcoholic and caffeinated beverages, as well as spicy foods. This may help prevent hot flashes.  Get 7-8 hours of sleep each night.  If you have hot flashes, try: ? Dressing in layers. ? Avoiding things that may trigger hot flashes, such as spicy food, warm places, or stress. ? Taking slow, deep breaths when a hot flash starts. ? Keeping a fan in your home and office.  Find ways to manage stress, such as deep breathing, meditation, or journaling.  Consider going to group therapy with other women who are having menopause symptoms. Ask your health care provider about recommended group therapy meetings. Eating and   drinking  Eat a healthy, balanced diet that contains whole grains, lean protein, low-fat dairy, and plenty of fruits and vegetables.  Your health care provider may recommend  adding more soy to your diet. Foods that contain soy include tofu, tempeh, and soy milk.  Eat plenty of foods that contain calcium and vitamin D for bone health. Items that are rich in calcium include low-fat milk, yogurt, beans, almonds, sardines, broccoli, and kale. Medicines  Take over-the-counter and prescription medicines only as told by your health care provider.  Talk with your health care provider before starting any herbal supplements. If prescribed, take vitamins and supplements as told by your health care provider. These may include: ? Calcium. Women age 24 and older should get 1,200 mg (milligrams) of calcium every day. ? Vitamin D. Women need 600-800 International Units of vitamin D each day. ? Vitamins B12 and B6. Aim for 50 micrograms of B12 and 1.5 mg of B6 each day. General instructions  Keep track of your menstrual periods, including: ? When they occur. ? How heavy they are and how long they last. ? How much time passes between periods.  Keep track of your symptoms, noting when they start, how often you have them, and how long they last.  Use vaginal lubricants or moisturizers to help with vaginal dryness and improve comfort during sex.  Keep all follow-up visits as told by your health care provider. This is important. This includes any group therapy or counseling. Contact a health care provider if:  You are still having menstrual periods after age 25.  You have pain during sex.  You have not had a period for 12 months and you develop vaginal bleeding. Get help right away if:  You have: ? Severe depression. ? Excessive vaginal bleeding. ? Pain when you urinate. ? A fast or irregular heart beat (palpitations). ? Severe headaches. ? Abdomen (abdominal) pain or severe indigestion.  You fell and you think you have a broken bone.  You develop leg or chest pain.  You develop vision problems.  You feel a lump in your breast. Summary  Menopause is the normal  time of life when menstrual periods stop completely. It is usually confirmed by 12 months without a menstrual period.  The transition to menopause (perimenopause) most often happens between the ages of 44 and 40.  Symptoms can be managed through medicines, lifestyle changes, and complementary therapies such as acupuncture.  Eat a balanced diet that is rich in nutrients to promote bone health and heart health and to manage symptoms during menopause. This information is not intended to replace advice given to you by your health care provider. Make sure you discuss any questions you have with your health care provider. Document Revised: 06/28/2017 Document Reviewed: 08/18/2016 Elsevier Patient Education  2020 [employer]  Liraglutide injection (Weight Management) What is this medicine? LIRAGLUTIDE (LIR a GLOO tide) is used to help people lose weight and maintain weight loss. It is used with a reduced-calorie diet and exercise. This medicine may be used for other purposes; ask your health care provider or pharmacist if you have questions. COMMON BRAND NAME(S): Saxenda What should I tell my health care provider before I take this medicine? They need to know if you have any of these conditions:  endocrine tumors (MEN 2) or if someone in your family had these tumors  gallbladder disease  high cholesterol  history of alcohol abuse problem  history of pancreatitis  kidney disease or if you are  on dialysis  liver disease  previous swelling of the tongue, face, or lips with difficulty breathing, difficulty swallowing, hoarseness, or tightening of the throat  stomach problems  suicidal thoughts, plans, or attempt; a previous suicide attempt by you or a family member  thyroid cancer or if someone in your family had thyroid cancer  an unusual or allergic reaction to liraglutide, other medicines, foods, dyes, or preservatives  pregnant or trying to get pregnant  breast-feeding How  should I use this medicine? This medicine is for injection under the skin of your upper leg, stomach area, or upper arm. You will be taught how to prepare and give this medicine. Use exactly as directed. Take your medicine at regular intervals. Do not take it more often than directed. This drug comes with INSTRUCTIONS FOR USE. Ask your pharmacist for directions on how to use this drug. Read the information carefully. Talk to your pharmacist or health care provider if you have questions. It is important that you put your used needles and syringes in a special sharps container. Do not put them in a trash can. If you do not have a sharps container, call your pharmacist or healthcare provider to get one. A special MedGuide will be given to you by the pharmacist with each prescription and refill. Be sure to read this information carefully each time. Talk to your pediatrician regarding the use of this medicine in children. Special care may be needed. Overdosage: If you think you have taken too much of this medicine contact a poison control center or emergency room at once. NOTE: This medicine is only for you. Do not share this medicine with others. What if I miss a dose? If you miss a dose, take it as soon as you can. If it is almost time for your next dose, take only that dose. Do not take double or extra doses. If you miss your dose for 3 days or more, call your doctor or health care professional to talk about how to restart this medicine. What may interact with this medicine?  insulin and other medicines for diabetes This list may not describe all possible interactions. Give your health care provider a list of all the medicines, herbs, non-prescription drugs, or dietary supplements you use. Also tell them if you smoke, drink alcohol, or use illegal drugs. Some items may interact with your medicine. What should I watch for while using this medicine? Visit your doctor or health care professional for  regular checks on your progress. Drink plenty of fluids while taking this medicine. Check with your doctor or health care professional if you get an attack of severe diarrhea, nausea, and vomiting. The loss of too much body fluid can make it dangerous for you to take this medicine. This medicine may affect blood sugar levels. Ask your healthcare provider if changes in diet or medicines are needed if you have diabetes. Patients and their families should watch out for worsening depression or thoughts of suicide. Also watch out for sudden changes in feelings such as feeling anxious, agitated, panicky, irritable, hostile, aggressive, impulsive, severely restless, overly excited and hyperactive, or not being able to sleep. If this happens, especially at the beginning of treatment or after a change in dose, call your health care professional. Women should inform their health care provider if they wish to become pregnant or think they might be pregnant. Losing weight while pregnant is not advised and may cause harm to the unborn child. Talk to your health care  provider for more information. What side effects may I notice from receiving this medicine? Side effects that you should report to your doctor or health care professional as soon as possible:  allergic reactions like skin rash, itching or hives, swelling of the face, lips, or tongue  breathing problems  diarrhea that continues or is severe  lump or swelling on the neck  severe nausea  signs and symptoms of infection like fever or chills; cough; sore throat; pain or trouble passing urine  signs and symptoms of low blood sugar such as feeling anxious; confusion; dizziness; increased hunger; unusually weak or tired; increased sweating; shakiness; cold, clammy skin; irritable; headache; blurred vision; fast heartbeat; loss of consciousness  signs and symptoms of kidney injury like trouble passing urine or change in the amount of urine  trouble  swallowing  unusual stomach upset or pain  vomiting Side effects that usually do not require medical attention (report to your doctor or health care professional if they continue or are bothersome):  constipation  decreased appetite  diarrhea  fatigue  headache  nausea  pain, redness, or irritation at site where injected  stomach upset  stuffy or runny nose This list may not describe all possible side effects. Call your doctor for medical advice about side effects. You may report side effects to FDA at 1-800-FDA-1088. Where should I keep my medicine? Keep out of the reach of children. Store unopened pen in a refrigerator between 2 and 8 degrees C (36 and 46 degrees F). Do not freeze or use if the medicine has been frozen. Protect from light and excessive heat. After you first use the pen, it can be stored at room temperature between 15 and 30 degrees C (59 and 86 degrees F) or in a refrigerator. Throw away your used pen after 30 days or after the expiration date, whichever comes first. Do not store your pen with the needle attached. If the needle is left on, medicine may leak from the pen. NOTE: This sheet is a summary. It may not cover all possible information. If you have questions about this medicine, talk to your doctor, pharmacist, or health care provider.  2020 Elsevier/Gold Standard (2019-05-21 21:16:59)

## 2019-08-24 NOTE — Progress Notes (Signed)
Subjective:    Patient ID: Bethany Murillo, female    DOB: 13-Aug-1965, 54 y.o.   MRN: [id number]  HPI Pt is a 54 yo female who presents to the clinic for medication refill and yearly visit.   Overall she is doing well. She is in menopause. She is having hot flashes mostly at night. She is not currently trying anything to make better.   Her allergies are fairly well controlled.   She admits she is not exercising and would like to lose weight.   .. Active Ambulatory Problems    Diagnosis Date Noted  . Vitamin D deficiency 01/14/2013  . Cough 12/24/2013  . Allergic rhinitis 12/24/2013  . Eyelid edema 12/30/2013  . Gallstones and inflammation of gallbladder without obstruction 06/29/2014  . Class 1 obesity due to excess calories without serious comorbidity with body mass index (BMI) of 32.0 to 32.9 in adult 08/15/2015  . Right knee pain 08/15/2015  . Upper airway cough syndrome 05/23/2016  . Overweight (BMI 25.0-29.9) 01/17/2018  . Menopausal symptom 08/25/2019   Resolved Ambulatory Problems    Diagnosis Date Noted  . Asthmatic bronchitis 11/13/2013   Past Medical History:  Diagnosis Date  . Unspecified vitamin D deficiency 01/14/2013   .Marland Kitchen Family History  Problem Relation Age of Onset  . Cancer Mother        colon cancer  . Stroke Father   . Cancer Maternal Grandmother    .Marland Kitchen Social History   Socioeconomic History  . Marital status: Married    Spouse name: Not on file  . Number of children: Not on file  . Years of education: Not on file  . Highest education level: Not on file  Occupational History  . Occupation: Programmer, multimedia: hospice    Comment: Hospice  Tobacco Use  . Smoking status: Never Smoker  . Smokeless tobacco: Never Used  Substance and Sexual Activity  . Alcohol use: No  . Drug use: No  . Sexual activity: Yes  Other Topics Concern  . Not on file  Social History Narrative  . Not on file   Social Determinants of Health   Financial Resource  Strain:   . Difficulty of Paying Living Expenses: Not on file  Food Insecurity:   . Worried About Charity fundraiser in the Last Year: Not on file  . Ran Out of Food in the Last Year: Not on file  Transportation Needs:   . Lack of Transportation (Medical): Not on file  . Lack of Transportation (Non-Medical): Not on file  Physical Activity:   . Days of Exercise per Week: Not on file  . Minutes of Exercise per Session: Not on file  Stress:   . Feeling of Stress : Not on file  Social Connections:   . Frequency of Communication with Friends and Family: Not on file  . Frequency of Social Gatherings with Friends and Family: Not on file  . Attends Religious Services: Not on file  . Active Member of Clubs or Organizations: Not on file  . Attends Archivist Meetings: Not on file  . Marital Status: Not on file  Intimate Partner Violence:   . Fear of Current or Ex-Partner: Not on file  . Emotionally Abused: Not on file  . Physically Abused: Not on file  . Sexually Abused: Not on file     Review of Systems  All other systems reviewed and are negative.      Objective:  Physical Exam Vitals reviewed.  Constitutional:      Appearance: Normal appearance.  HENT:     Head: Normocephalic.     Right Ear: Tympanic membrane normal.     Left Ear: Tympanic membrane normal.     Mouth/Throat:     Mouth: Mucous membranes are moist.  Eyes:     Extraocular Movements: Extraocular movements intact.     Pupils: Pupils are equal, round, and reactive to light.     Comments: Ptosis bilateral eyelids.   Neck:     Vascular: No carotid bruit.  Cardiovascular:     Rate and Rhythm: Normal rate and regular rhythm.     Pulses: Normal pulses.  Pulmonary:     Effort: Pulmonary effort is normal.     Breath sounds: Normal breath sounds.  Abdominal:     General: Bowel sounds are normal.     Palpations: Abdomen is soft.     Tenderness: There is no abdominal tenderness.  Lymphadenopathy:      Cervical: No cervical adenopathy.  Neurological:     General: No focal deficit present.     Mental Status: She is alert and oriented to person, place, and time.  Psychiatric:        Mood and Affect: Mood normal.       .. Depression screen [facility] 2/9 08/24/2019 01/17/2018  Decreased Interest 0 0  Down, Depressed, Hopeless 0 1  PHQ - 2 Score 0 1  Altered sleeping 1 1  Tired, decreased energy 0 3  Change in appetite 1 2  Feeling bad or failure about yourself  0 0  Trouble concentrating 0 0  Moving slowly or fidgety/restless 0 0  Suicidal thoughts 0 -  PHQ-9 Score 2 7  Difficult doing work/chores Not difficult at all Somewhat difficult       Assessment & Plan:  Marland KitchenMarland KitchenZyah was seen today for medication management.  Diagnoses and all orders for this visit:  Routine physical examination -     TSH -     CBC -     VITAMIN D 25 Hydroxy (Vit-D Deficiency, Fractures) -     Lipid panel -     Comprehensive metabolic panel  Vitamin D deficiency -     Vitamin D, Ergocalciferol, (DRISDOL) 1.25 MG (50000 UNIT) CAPS capsule; TAKE 1 CAPSULE BY MOUTH EVERY 7 DAYS -     VITAMIN D 25 Hydroxy (Vit-D Deficiency, Fractures)  Screening for thyroid disorder -     TSH  Screening for diabetes mellitus -     Comprehensive metabolic panel  Screening for lipid disorders -     Lipid panel  Visit for screening mammogram -     MM 3D SCREEN BREAST BILATERAL  Class 1 obesity due to excess calories without serious comorbidity with body mass index (BMI) of 32.0 to 32.9 in adult  Menopausal symptom  Chronic rhinitis -     montelukast (SINGULAIR) 10 MG tablet; Take 1 tablet (10 mg total) by mouth at bedtime.  Other orders -     naproxen (NAPROSYN) 500 MG tablet; Take 1 tablet (500 mg total) by mouth 2 (two) times daily with a meal.   .. Discussed 150 minutes of exercise a week.  Encouraged vitamin D 1000 units and Calcium 1300mg  or 4 servings of dairy a day.  Fasting labs ordered.  mammo  ordered.  Colonoscopy UTD. Flu declined.  Consider shingrix.   Medications refilled.   Discussed black kohosh for menopausal symptoms. Other off label  medications if she would like to try something. Pt will consider.   Marland Kitchen.Discussed low carb diet with 1500 calories and 80g of protein.  Exercising at least 150 minutes a week.  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  Discussed IF 16:8.  Discussed medication. Consider saxenda.

## 2019-08-25 DIAGNOSIS — Z1329 Encounter for screening for other suspected endocrine disorder: Secondary | ICD-10-CM | POA: Diagnosis not present

## 2019-08-25 DIAGNOSIS — Z13220 Encounter for screening for lipoid disorders: Secondary | ICD-10-CM | POA: Diagnosis not present

## 2019-08-25 DIAGNOSIS — Z0000 Encounter for general adult medical examination without abnormal findings: Secondary | ICD-10-CM | POA: Diagnosis not present

## 2019-08-25 DIAGNOSIS — Z131 Encounter for screening for diabetes mellitus: Secondary | ICD-10-CM | POA: Diagnosis not present

## 2019-08-25 DIAGNOSIS — N951 Menopausal and female climacteric states: Secondary | ICD-10-CM | POA: Insufficient documentation

## 2019-08-25 DIAGNOSIS — E559 Vitamin D deficiency, unspecified: Secondary | ICD-10-CM | POA: Diagnosis not present

## 2019-08-26 LAB — COMPREHENSIVE METABOLIC PANEL
ALT: 21 IU/L (ref 0–32)
AST: 26 IU/L (ref 0–40)
Albumin/Globulin Ratio: 1.6 (ref 1.2–2.2)
Albumin: 4.4 g/dL (ref 3.8–4.9)
Alkaline Phosphatase: 75 IU/L (ref 39–117)
BUN/Creatinine Ratio: 9 (ref 9–23)
BUN: 9 mg/dL (ref 6–24)
Bilirubin Total: 0.4 mg/dL (ref 0.0–1.2)
CO2: 22 mmol/L (ref 20–29)
Calcium: 9.7 mg/dL (ref 8.7–10.2)
Chloride: 108 mmol/L — ABNORMAL HIGH (ref 96–106)
Creatinine, Ser: 1.02 mg/dL — ABNORMAL HIGH (ref 0.57–1.00)
GFR calc Af Amer: 72 mL/min/{1.73_m2} (ref >59)
GFR calc non Af Amer: 63 mL/min/{1.73_m2} (ref >59)
Globulin, Total: 2.7 g/dL (ref 1.5–4.5)
Glucose: 102 mg/dL — ABNORMAL HIGH (ref 65–99)
Potassium: 5 mmol/L (ref 3.5–5.2)
Sodium: 143 mmol/L (ref 134–144)
Total Protein: 7.1 g/dL (ref 6.0–8.5)

## 2019-08-26 LAB — CBC
Hematocrit: 36.9 % (ref 34.0–46.6)
Hemoglobin: 11.9 g/dL (ref 11.1–15.9)
MCH: 27.5 pg (ref 26.6–33.0)
MCHC: 32.2 g/dL (ref 31.5–35.7)
MCV: 85 fL (ref 79–97)
Platelets: 246 10*3/uL (ref 150–450)
RBC: 4.32 x10E6/uL (ref 3.77–5.28)
RDW: 13.4 % (ref 11.7–15.4)
WBC: 7.1 10*3/uL (ref 3.4–10.8)

## 2019-08-26 LAB — LIPID PANEL
Chol/HDL Ratio: 3.7 ratio (ref 0.0–4.4)
Cholesterol, Total: 166 mg/dL (ref 100–199)
HDL: 45 mg/dL (ref >39)
LDL Chol Calc (NIH): 105 mg/dL — ABNORMAL HIGH (ref 0–99)
Triglycerides: 88 mg/dL (ref 0–149)
VLDL Cholesterol Cal: 16 mg/dL (ref 5–40)

## 2019-08-26 LAB — VITAMIN D 25 HYDROXY (VIT D DEFICIENCY, FRACTURES): Vit D, 25-Hydroxy: 17.1 ng/mL — ABNORMAL LOW (ref 30.0–100.0)

## 2019-08-26 LAB — TSH: TSH: 1.69 u[IU]/mL (ref 0.450–4.500)

## 2019-08-26 NOTE — Progress Notes (Signed)
[name],   Thyroid is normal. CBC looks great. Vitamin D is very low. Must take vitamin D regularly. Fasting glucose up a little. Will add A!C. Kidney function down just a hair. Make sure staying hydrated. Cholesterol looks good.   Bethany Murillo

## 2019-08-27 NOTE — Progress Notes (Signed)
Bethany Murillo,   Normal A1C but close to the pre-diabetic range. So continue to work on diet changes and we will monitor.   -Lesly Rubenstein

## 2019-08-28 NOTE — Addendum Note (Signed)
Addended by: Jomarie Longs on: 08/28/2019 01:18 PM   Modules accepted: Orders

## 2019-08-28 NOTE — Progress Notes (Signed)
Allergy referral made

## 2019-08-29 LAB — SPECIMEN STATUS REPORT

## 2019-08-29 LAB — HGB A1C W/O EAG: Hgb A1c MFr Bld: 5.6 % (ref 4.8–5.6)

## 2019-09-24 ENCOUNTER — Other Ambulatory Visit: Payer: Self-pay

## 2019-09-24 ENCOUNTER — Ambulatory Visit (INDEPENDENT_AMBULATORY_CARE_PROVIDER_SITE_OTHER): Payer: BC Managed Care – PPO

## 2019-09-24 DIAGNOSIS — Z1231 Encounter for screening mammogram for malignant neoplasm of breast: Secondary | ICD-10-CM | POA: Diagnosis not present

## 2019-09-25 NOTE — Progress Notes (Signed)
Normal mammogram. Follow up in 1 year.

## 2019-12-24 ENCOUNTER — Encounter: Payer: Self-pay | Admitting: Physician Assistant

## 2019-12-29 MED ORDER — PHENTERMINE HCL 37.5 MG PO TABS: 37.5000 mg | ORAL_TABLET | Freq: Every day | ORAL | 0 refills | Status: DC

## 2020-01-05 ENCOUNTER — Telehealth: Payer: BC Managed Care – PPO | Admitting: Physician Assistant

## 2020-01-05 DIAGNOSIS — R05 Cough: Secondary | ICD-10-CM | POA: Diagnosis not present

## 2020-01-05 DIAGNOSIS — R059 Cough, unspecified: Secondary | ICD-10-CM

## 2020-01-05 MED ORDER — AZITHROMYCIN 250 MG PO TABS: ORAL_TABLET | ORAL | 0 refills | Status: DC

## 2020-01-05 NOTE — Progress Notes (Signed)
We are sorry that you are not feeling well.  Here is how we plan to help!  Based on your presentation I believe you most likely have A cough due to bacteria.  When patients have a fever and a productive cough with a change in color or increased sputum production, we are concerned about bacterial bronchitis.  If left untreated it can progress to pneumonia.  If your symptoms do not improve with your treatment plan it is important that you contact your provider.   I have prescribed Azithromyin 250 mg: two tablets now and then one tablet daily for 4 additonal days    In addition you may use A prescription cough medication called Tessalon Perles 100mg . You may take 1-2 capsules every 8 hours as needed for your cough.  We cannot prescribe narcotic cough syrup over an e-visit  From your responses in the eVisit questionnaire you describe inflammation in the upper respiratory tract which is causing a significant cough.  This is commonly called Bronchitis and has four common causes:    Allergies  Viral Infections  Acid Reflux  Bacterial Infection Allergies, viruses and acid reflux are treated by controlling symptoms or eliminating the cause. An example might be a cough caused by taking certain blood pressure medications. You stop the cough by changing the medication. Another example might be a cough caused by acid reflux. Controlling the reflux helps control the cough.  USE OF BRONCHODILATOR ("RESCUE") INHALERS: There is a risk from using your bronchodilator too frequently.  The risk is that over-reliance on a medication which only relaxes the muscles surrounding the breathing tubes can reduce the effectiveness of medications prescribed to reduce swelling and congestion of the tubes themselves.  Although you feel brief relief from the bronchodilator inhaler, your asthma may actually be worsening with the tubes becoming more swollen and filled with mucus.  This can delay other crucial treatments, such as  oral steroid medications. If you need to use a bronchodilator inhaler daily, several times per day, you should discuss this with your provider.  There are probably better treatments that could be used to keep your asthma under control.     HOME CARE  Only take medications as instructed by your medical team.  Complete the entire course of an antibiotic.  Drink plenty of fluids and get plenty of rest.  Avoid close contacts especially the very young and the elderly  Cover your mouth if you cough or cough into your sleeve.  Always remember to wash your hands  A steam or ultrasonic humidifier can help congestion.   GET HELP RIGHT AWAY IF:  You develop worsening fever.  You become short of breath  You cough up blood.  Your symptoms persist after you have completed your treatment plan MAKE SURE YOU   Understand these instructions.  Will watch your condition.  Will get help right away if you are not doing well or get worse.  Your e-visit answers were reviewed by a board certified advanced clinical practitioner to complete your personal care plan.  Depending on the condition, your plan could have included both over the counter or prescription medications. If there is a problem please reply  once you have received a response from your provider. Your safety is important to .  If you have drug allergies check your prescription carefully.    You can use MyChart to ask questions about todays visit, request a non-urgent call back, or ask for a work or school excuse for 24  hours related to this e-Visit. If it has been greater than 24 hours you will need to follow up with your provider, or enter a new e-Visit to address those concerns. You will get an e-mail in the next two days asking about your experience.  I hope that your e-visit has been valuable and will speed your recovery. Thank you for using e-visits.  5 minutes spent on chart

## 2020-02-08 DIAGNOSIS — M9903 Segmental and somatic dysfunction of lumbar region: Secondary | ICD-10-CM | POA: Diagnosis not present

## 2020-02-08 DIAGNOSIS — M9901 Segmental and somatic dysfunction of cervical region: Secondary | ICD-10-CM | POA: Diagnosis not present

## 2020-02-08 DIAGNOSIS — M62830 Muscle spasm of back: Secondary | ICD-10-CM | POA: Diagnosis not present

## 2020-02-08 DIAGNOSIS — M545 Low back pain: Secondary | ICD-10-CM | POA: Diagnosis not present

## 2020-02-10 DIAGNOSIS — M9903 Segmental and somatic dysfunction of lumbar region: Secondary | ICD-10-CM | POA: Diagnosis not present

## 2020-02-10 DIAGNOSIS — M9901 Segmental and somatic dysfunction of cervical region: Secondary | ICD-10-CM | POA: Diagnosis not present

## 2020-02-10 DIAGNOSIS — M62830 Muscle spasm of back: Secondary | ICD-10-CM | POA: Diagnosis not present

## 2020-02-10 DIAGNOSIS — M545 Low back pain: Secondary | ICD-10-CM | POA: Diagnosis not present

## 2020-02-11 DIAGNOSIS — M545 Low back pain: Secondary | ICD-10-CM | POA: Diagnosis not present

## 2020-02-11 DIAGNOSIS — M62830 Muscle spasm of back: Secondary | ICD-10-CM | POA: Diagnosis not present

## 2020-02-11 DIAGNOSIS — M9903 Segmental and somatic dysfunction of lumbar region: Secondary | ICD-10-CM | POA: Diagnosis not present

## 2020-02-11 DIAGNOSIS — M9901 Segmental and somatic dysfunction of cervical region: Secondary | ICD-10-CM | POA: Diagnosis not present

## 2020-02-12 ENCOUNTER — Encounter: Payer: Self-pay | Admitting: Physician Assistant

## 2020-02-17 ENCOUNTER — Ambulatory Visit (INDEPENDENT_AMBULATORY_CARE_PROVIDER_SITE_OTHER): Payer: BC Managed Care – PPO

## 2020-02-17 ENCOUNTER — Ambulatory Visit: Payer: BC Managed Care – PPO | Admitting: Physician Assistant

## 2020-02-17 ENCOUNTER — Other Ambulatory Visit: Payer: Self-pay

## 2020-02-17 VITALS — BP 120/85 | HR 71 | Ht 68.0 in | Wt 205.0 lb

## 2020-02-17 DIAGNOSIS — Z6831 Body mass index (BMI) 31.0-31.9, adult: Secondary | ICD-10-CM

## 2020-02-17 DIAGNOSIS — R053 Chronic cough: Secondary | ICD-10-CM | POA: Insufficient documentation

## 2020-02-17 DIAGNOSIS — R05 Cough: Secondary | ICD-10-CM | POA: Diagnosis not present

## 2020-02-17 DIAGNOSIS — N951 Menopausal and female climacteric states: Secondary | ICD-10-CM

## 2020-02-17 DIAGNOSIS — M25473 Effusion, unspecified ankle: Secondary | ICD-10-CM

## 2020-02-17 DIAGNOSIS — E6609 Other obesity due to excess calories: Secondary | ICD-10-CM | POA: Diagnosis not present

## 2020-02-17 MED ORDER — WEGOVY 0.25 MG/0.5ML ~~LOC~~ SOAJ: 0.2500 mg | SUBCUTANEOUS | 0 refills | Status: DC

## 2020-02-17 MED ORDER — PHENTERMINE HCL 37.5 MG PO TABS: 37.5000 mg | ORAL_TABLET | Freq: Every day | ORAL | 0 refills | Status: DC

## 2020-02-17 NOTE — Patient Instructions (Addendum)
Dr. Lester Kinsman- chiropractic and accupuncture for menopause  Zyrtec D daily at night.   unisom OTC.   Consider spironmetry.   Call in 3 weeks for update and increase dose.   Menopause and Hormone Replacement Therapy Menopause is a normal time of life when menstrual periods stop completely and the ovaries stop producing the female hormones estrogen and progesterone. This lack of hormones can affect your health and cause undesirable symptoms. Hormone replacement therapy (HRT) can relieve some of those symptoms. What is hormone replacement therapy? HRT is the use of artificial (synthetic) hormones to replace hormones that your body has stopped producing because you have reached menopause. What are my options for HRT?  HRT may consist of the synthetic hormones estrogen and progestin, or it may consist of only estrogen (estrogen-only therapy). You and your health care provider will decide which form of HRT is best for you. If you choose to be on HRT and you have a uterus, estrogen and progestin are usually prescribed. Estrogen-only therapy is used for women who do not have a uterus. Possible options for taking HRT include:  Pills.  Patches.  Gels.  Sprays.  Vaginal cream.  Vaginal rings.  Vaginal inserts. The amount of hormone(s) that you take and how long you take the hormone(s) varies according to your health. It is important to:  Begin HRT with the lowest possible dosage.  Stop HRT as soon as your health care provider tells you to stop.  Work with your health care provider so that you feel informed and comfortable with your decisions. What are the benefits of HRT? HRT can reduce the frequency and severity of menopausal symptoms. Benefits of HRT vary according to the kind of symptoms that you have, how severe they are, and your overall health. HRT may help to improve the following symptoms of menopause:  Hot flashes and night sweats. These are sudden feelings of heat that spread over  the face and body. The skin may turn red, like a blush. Night sweats are hot flashes that happen while you are sleeping or trying to sleep.  Bone loss (osteoporosis). The body loses calcium more quickly after menopause, causing the bones to become weaker. This can increase the risk for bone breaks (fractures).  Vaginal dryness. The lining of the vagina can become thin and dry, which can cause pain during sex or cause infection, burning, or itching.  Urinary tract infections.  Urinary incontinence. This is the inability to control when you pass urine.  Irritability.  Short-term memory problems. What are the risks of HRT? Risks of HRT vary depending on your individual health and medical history. Risks of HRT also depend on whether you receive both estrogen and progestin or you receive estrogen only. HRT may increase the risk of:  Spotting. This is when a small amount of blood leaks from the vagina unexpectedly.  Endometrial cancer. This cancer is in the lining of the uterus (endometrium).  Breast cancer.  Increased density of breast tissue. This can make it harder to find breast cancer on a breast X-ray (mammogram).  Stroke.  Heart disease.  Blood clots.  Gallbladder disease.  Liver disease. Risks of HRT can increase if you have any of the following conditions:  Endometrial cancer.  Liver disease.  Heart disease.  Breast cancer.  History of blood clots.  History of stroke. Follow these instructions at home:  Take over-the-counter and prescription medicines only as told by your health care provider.  Get mammograms, pelvic exams, and medical  checkups as often as told by your health care provider.  Have Pap tests done as often as told by your health care provider. A Pap test is sometimes called a Pap smear. It is a screening test that is used to check for signs of cancer of the cervix and vagina. A Pap test can also identify the presence of infection or precancerous  changes. Pap tests may be done: ? Every 3 years, starting at age 96. ? Every 5 years, starting after age 40, in combination with testing for human papillomavirus (HPV). ? More often or less often depending on other medical conditions you have, your age, and other risk factors.  It is up to you to get the results of your Pap test. Ask your health care provider, or the department that is doing the test, when your results will be ready.  Keep all follow-up visits as told by your health care provider. This is important. Contact a health care provider if you have:  Pain or swelling in your legs.  Shortness of breath.  Chest pain.  Lumps or changes in your breasts or armpits.  Slurred speech.  Pain, burning, or bleeding when you urinate.  Unusual vaginal bleeding.  Dizziness or headaches.  Weakness or numbness in any part of your arms or legs.  Pain in your abdomen. Summary  Menopause is a normal time of life when menstrual periods stop completely and the ovaries stop producing the female hormones estrogen and progesterone.  Hormone replacement therapy (HRT) can relieve some of the symptoms of menopause.  HRT can reduce the frequency and severity of menopausal symptoms.  Risks of HRT vary depending on your individual health and medical history. This information is not intended to replace advice given to you by your health care provider. Make sure you discuss any questions you have with your health care provider. Document Revised: 03/18/2018 Document Reviewed: 03/18/2018 Elsevier Patient Education  2020 [employer].

## 2020-02-17 NOTE — Progress Notes (Signed)
Subjective:    Patient ID: Bethany Murillo, female    DOB: May 04, 1966, 54 y.o.   MRN: [id number]  HPI  Pt is a 54 yo obese female who presents to the clinic for follow up.   She has been without a period for over a year. She continues to have menopausal symptoms with sleep changes, hot flashes. She is not taking anything to make better.   She has been on phentermine for weight. She has lost 9lbs. She would like to continue.  She is trying to walk regularly and eat better.   She continues to have a cough since beginning of June. This happened before but had resolved. Denies any reflux, fever, chills, SOB, wheezing, sinus pressure, ear pain. She has some drainage going down the back of her throat.   She also has some pockets of fluid lateral bilateral ankles. No pain. Some days worse than others.  .. Active Ambulatory Problems    Diagnosis Date Noted  . Vitamin D deficiency 01/14/2013  . Cough 12/24/2013  . Allergic rhinitis 12/24/2013  . Eyelid edema 12/30/2013  . Gallstones and inflammation of gallbladder without obstruction 06/29/2014  . Class 1 obesity due to excess calories without serious comorbidity with body mass index (BMI) of 31.0 to 31.9 in adult 08/15/2015  . Right knee pain 08/15/2015  . Upper airway cough syndrome 05/23/2016  . Overweight (BMI 25.0-29.9) 01/17/2018  . Hot flashes, menopausal 08/25/2019  . Persistent cough 02/17/2020  . Ankle swelling 02/29/2020   Resolved Ambulatory Problems    Diagnosis Date Noted  . Asthmatic bronchitis 11/13/2013   Past Medical History:  Diagnosis Date  . Unspecified vitamin D deficiency 01/14/2013    Review of Systems  All other systems reviewed and are negative.      Objective:   Physical Exam Vitals reviewed.  Constitutional:      Appearance: Normal appearance. She is obese.  HENT:     Head: Normocephalic.     Right Ear: Tympanic membrane normal.     Left Ear: Tympanic membrane normal.     Nose: Nose normal.      Mouth/Throat:     Mouth: Mucous membranes are moist.     Pharynx: Oropharynx is clear.  Eyes:     Conjunctiva/sclera: Conjunctivae normal.     Pupils: Pupils are equal, round, and reactive to light.  Cardiovascular:     Rate and Rhythm: Normal rate and regular rhythm.  Pulmonary:     Effort: Pulmonary effort is normal.     Breath sounds: Normal breath sounds.  Abdominal:     Palpations: Abdomen is soft.  Musculoskeletal:        General: Normal range of motion.  Neurological:     General: No focal deficit present.     Mental Status: She is alert and oriented to person, place, and time.  Psychiatric:        Mood and Affect: Mood normal.           Assessment & Plan:  Marland KitchenMarland KitchenDazja was seen today for weight check.  Diagnoses and all orders for this visit:  Class 1 obesity due to excess calories without serious comorbidity with body mass index (BMI) of 31.0 to 31.9 in adult -     Semaglutide-Weight Management (WEGOVY) 0.25 MG/0.5ML SOAJ; Inject 0.25 mg into the skin once a week. -     phentermine (ADIPEX-P) 37.5 MG tablet; Take 1 tablet (37.5 mg total) by mouth daily before breakfast.  Hot flashes,  menopausal  Persistent cough -     DG Chest 2 View  Ankle swelling, unspecified laterality    Pt tolerates phentermine well and would like to continue but has only lost 9lbs. Discussed other options.  Phentermine could also be making her hot flashes and mood worse.  Agreed to [employer].  Sent to pharmacy. Discussed SE.  Update in 3 weeks.  Marland Kitchen.Discussed low carb diet with 1500 calories and 80g of protein.  Exercising at least 150 minutes a week.  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   Discussed menopausal symptoms.  HRT discussed risk and benefits. Pt wants to hold off for now.  Could consider SSRI trial for hot flashes and sleep. Will hold off.  Try black kohosh and Dr. Lester Kinsman for more natural approach.  unisom for sleep.   CXR for cough evaluation.  Seems like more  PND. Start zyrtec D.  Let me know how doing in 3 weeks.  Hx of upper airway cough syndrome.  Continue on singulair.  Consider PPI.   Looks like some fluid in bursa of lateral malleolus. Follow up with sports medicine if becomes painful or consider u/s or drainage.   Spent 35 minutes with patient.

## 2020-02-18 NOTE — Progress Notes (Signed)
No acute or chronic changes seen on CXR.

## 2020-02-19 ENCOUNTER — Encounter: Payer: Self-pay | Admitting: Physician Assistant

## 2020-02-29 DIAGNOSIS — M25473 Effusion, unspecified ankle: Secondary | ICD-10-CM | POA: Insufficient documentation

## 2020-06-15 ENCOUNTER — Ambulatory Visit: Payer: BC Managed Care – PPO | Admitting: Physician Assistant

## 2020-06-28 ENCOUNTER — Encounter: Payer: Self-pay | Admitting: Physician Assistant

## 2020-06-28 ENCOUNTER — Other Ambulatory Visit: Payer: Self-pay

## 2020-06-28 ENCOUNTER — Ambulatory Visit (INDEPENDENT_AMBULATORY_CARE_PROVIDER_SITE_OTHER): Payer: BC Managed Care – PPO | Admitting: Physician Assistant

## 2020-06-28 VITALS — BP 134/86 | HR 84 | Ht 68.0 in | Wt 205.0 lb

## 2020-06-28 DIAGNOSIS — M545 Low back pain, unspecified: Secondary | ICD-10-CM

## 2020-06-28 MED ORDER — MELOXICAM 15 MG PO TABS: 15.0000 mg | ORAL_TABLET | Freq: Every day | ORAL | 0 refills | Status: DC

## 2020-06-28 MED ORDER — PREDNISONE 50 MG PO TABS: 50.0000 mg | ORAL_TABLET | Freq: Every day | ORAL | 0 refills | Status: DC

## 2020-06-28 MED ORDER — CYCLOBENZAPRINE HCL 5 MG PO TABS: 5.0000 mg | ORAL_TABLET | Freq: Three times a day (TID) | ORAL | 0 refills | Status: DC | PRN

## 2020-06-28 MED ORDER — KETOROLAC TROMETHAMINE 60 MG/2ML IM SOLN
60.0000 mg | Freq: Once | INTRAMUSCULAR | Status: AC
  Administered 2020-06-28: 60 mg via INTRAMUSCULAR

## 2020-06-28 NOTE — Progress Notes (Signed)
Subjective:    Patient ID: Bethany Murillo, female    DOB: January 16, 1966, 54 y.o.   MRN: [id number]  HPI  Patient is a 54 year old obese female who presents to the clinic with 4 days of low back pain with radiation to the left.  About 4 days ago she was pushing heavy display case.  She admits to straining but no initial injury.  About 15 minutes later she bent over and felt an immediate pain in her low back with radiation to the left.  She denies any bowel or bladder dysfunction, saddle anesthesia, leg weakness.  She has no radiation of pain, numbness, tingling down her leg.  Patient has never had anything happen to her back before.  She has no previous diagnosis of this.  She is used ice which helps a little.  She is taking naproxen multiple times a day which also helps.  It does feel a little better today.  Pain is made worse by twisting bending lifting.  .. Active Ambulatory Problems    Diagnosis Date Noted  . Vitamin D deficiency 01/14/2013  . Cough 12/24/2013  . Allergic rhinitis 12/24/2013  . Eyelid edema 12/30/2013  . Gallstones and inflammation of gallbladder without obstruction 06/29/2014  . Class 1 obesity due to excess calories without serious comorbidity with body mass index (BMI) of 31.0 to 31.9 in adult 08/15/2015  . Right knee pain 08/15/2015  . Upper airway cough syndrome 05/23/2016  . Overweight (BMI 25.0-29.9) 01/17/2018  . Hot flashes, menopausal 08/25/2019  . Persistent cough 02/17/2020  . Ankle swelling 02/29/2020  . Acute left-sided low back pain without sciatica 06/28/2020   Resolved Ambulatory Problems    Diagnosis Date Noted  . Asthmatic bronchitis 11/13/2013   Past Medical History:  Diagnosis Date  . Unspecified vitamin D deficiency 01/14/2013      Review of Systems  All other systems reviewed and are negative.      Objective:   Physical Exam Vitals reviewed.  Constitutional:      Appearance: Normal appearance. She is obese.  Cardiovascular:      Rate and Rhythm: Normal rate and regular rhythm.     Pulses: Normal pulses.  Pulmonary:     Effort: Pulmonary effort is normal.     Breath sounds: Normal breath sounds.  Musculoskeletal:     Right lower leg: No edema.     Left lower leg: No edema.     Comments: Very limited ROM at waist due to pain.  No tenderness to palpation over lumbar spine.  Tenderness left lumbar paraspinal muscles and into buttocks.  NROM at hip.  SLR on left created lots of tension and pain in left low back but no radiation/numbness/tinglgin into left leg.  5/5 lower ext bilateral strength.  2+patellar reflexes, bilaterally.   Neurological:     General: No focal deficit present.     Mental Status: She is alert and oriented to person, place, and time.  Psychiatric:        Mood and Affect: Mood normal.        Behavior: Behavior normal.           Assessment & Plan:  Marland KitchenMarland KitchenAhmia was seen today for back pain.  Diagnoses and all orders for this visit:  Acute left-sided low back pain without sciatica -     cyclobenzaprine (FLEXERIL) 5 MG tablet; Take 1 tablet (5 mg total) by mouth 3 (three) times daily as needed for muscle spasms. -  meloxicam (MOBIC) 15 MG tablet; Take 1 tablet (15 mg total) by mouth daily. -     predniSONE (DELTASONE) 50 MG tablet; Take 1 tablet (50 mg total) by mouth daily. -     ketorolac (TORADOL) injection 60 mg   No red flag signs or symptoms.  Discussed etiology. Hold on imaging today.  toradol 60mg  IM given in office today.  Prednisone burst to start mobic for next 2 weeks.  Flexeril as needed.  Start lower back stretches.  Discussed good lifting techniques.  Encouraged heat, ice, tens unit.  Follow up as needed, if symptoms worsen or not improving.   She is frustrated with weight. She gains when she comes off phentermine. Discussed long term options such as qsymia. HO given. Pt will consider and let me know.

## 2020-06-28 NOTE — Patient Instructions (Addendum)
toradol today.  Prednisone start tomorrow for 5 days.  mobic after the 5 days for 2 weeks.  Start exercises and walking.  Heat and ice are beneficial.  Flexeril as needed but may make sleepy.   qsymia for weight loss.   Low Back Sprain or Strain Rehab Ask your health care provider which exercises are safe for you. Do exercises exactly as told by your health care provider and adjust them as directed. It is normal to feel mild stretching, pulling, tightness, or discomfort as you do these exercises. Stop right away if you feel sudden pain or your pain gets worse. Do not begin these exercises until told by your health care provider. Stretching and range-of-motion exercises These exercises warm up your muscles and joints and improve the movement and flexibility of your back. These exercises also help to relieve pain, numbness, and tingling. Lumbar rotation  1. Lie on your back on a firm surface and bend your knees. 2. Straighten your arms out to your sides so each arm forms a 90-degree angle (right angle) with a side of your body. 3. Slowly move (rotate) both of your knees to one side of your body until you feel a stretch in your lower back (lumbar). Try not to let your shoulders lift off the floor. 4. Hold this position for __________ seconds. 5. Tense your abdominal muscles and slowly move your knees back to the starting position. 6. Repeat this exercise on the other side of your body. Repeat __________ times. Complete this exercise __________ times a day. Single knee to chest  1. Lie on your back on a firm surface with both legs straight. 2. Bend one of your knees. Use your hands to move your knee up toward your chest until you feel a gentle stretch in your lower back and buttock. ? Hold your leg in this position by holding on to the front of your knee. ? Keep your other leg as straight as possible. 3. Hold this position for __________ seconds. 4. Slowly return to the starting  position. 5. Repeat with your other leg. Repeat __________ times. Complete this exercise __________ times a day. Prone extension on elbows  1. Lie on your abdomen on a firm surface (prone position). 2. Prop yourself up on your elbows. 3. Use your arms to help lift your chest up until you feel a gentle stretch in your abdomen and your lower back. ? This will place some of your body weight on your elbows. If this is uncomfortable, try stacking pillows under your chest. ? Your hips should stay down, against the surface that you are lying on. Keep your hip and back muscles relaxed. 4. Hold this position for __________ seconds. 5. Slowly relax your upper body and return to the starting position. Repeat __________ times. Complete this exercise __________ times a day. Strengthening exercises These exercises build strength and endurance in your back. Endurance is the ability to use your muscles for a long time, even after they get tired. Pelvic tilt This exercise strengthens the muscles that lie deep in the abdomen. 1. Lie on your back on a firm surface. Bend your knees and keep your feet flat on the floor. 2. Tense your abdominal muscles. Tip your pelvis up toward the ceiling and flatten your lower back into the floor. ? To help with this exercise, you may place a small towel under your lower back and try to push your back into the towel. 3. Hold this position for __________ seconds. 4.  Let your muscles relax completely before you repeat this exercise. Repeat __________ times. Complete this exercise __________ times a day. Alternating arm and leg raises  1. Get on your hands and knees on a firm surface. If you are on a hard floor, you may want to use padding, such as an exercise mat, to cushion your knees. 2. Line up your arms and legs. Your hands should be directly below your shoulders, and your knees should be directly below your hips. 3. Lift your left leg behind you. At the same time, raise  your right arm and straighten it in front of you. ? Do not lift your leg higher than your hip. ? Do not lift your arm higher than your shoulder. ? Keep your abdominal and back muscles tight. ? Keep your hips facing the ground. ? Do not arch your back. ? Keep your balance carefully, and do not hold your breath. 4. Hold this position for __________ seconds. 5. Slowly return to the starting position. 6. Repeat with your right leg and your left arm. Repeat __________ times. Complete this exercise __________ times a day. Abdominal set with straight leg raise  1. Lie on your back on a firm surface. 2. Bend one of your knees and keep your other leg straight. 3. Tense your abdominal muscles and lift your straight leg up, 4-6 inches (10-15 cm) off the ground. 4. Keep your abdominal muscles tight and hold this position for __________ seconds. ? Do not hold your breath. ? Do not arch your back. Keep it flat against the ground. 5. Keep your abdominal muscles tense as you slowly lower your leg back to the starting position. 6. Repeat with your other leg. Repeat __________ times. Complete this exercise __________ times a day. Single leg lower with bent knees 1. Lie on your back on a firm surface. 2. Tense your abdominal muscles and lift your feet off the floor, one foot at a time, so your knees and hips are bent in 90-degree angles (right angles). ? Your knees should be over your hips and your lower legs should be parallel to the floor. 3. Keeping your abdominal muscles tense and your knee bent, slowly lower one of your legs so your toe touches the ground. 4. Lift your leg back up to return to the starting position. ? Do not hold your breath. ? Do not let your back arch. Keep your back flat against the ground. 5. Repeat with your other leg. Repeat __________ times. Complete this exercise __________ times a day. Posture and body mechanics Good posture and healthy body mechanics can help to relieve  stress in your body's tissues and joints. Body mechanics refers to the movements and positions of your body while you do your daily activities. Posture is part of body mechanics. Good posture means:  Your spine is in its natural S-curve position (neutral).  Your shoulders are pulled back slightly.  Your head is not tipped forward. Follow these guidelines to improve your posture and body mechanics in your everyday activities. Standing   When standing, keep your spine neutral and your feet about hip width apart. Keep a slight bend in your knees. Your ears, shoulders, and hips should line up.  When you do a task in which you stand in one place for a long time, place one foot up on a stable object that is 2-4 inches (5-10 cm) high, such as a footstool. This helps keep your spine neutral. Sitting   When sitting, keep your  spine neutral and keep your feet flat on the floor. Use a footrest, if necessary, and keep your thighs parallel to the floor. Avoid rounding your shoulders, and avoid tilting your head forward.  When working at a desk or a computer, keep your desk at a height where your hands are slightly lower than your elbows. Slide your chair under your desk so you are close enough to maintain good posture.  When working at a computer, place your monitor at a height where you are looking straight ahead and you do not have to tilt your head forward or downward to look at the screen. Resting  When lying down and resting, avoid positions that are most painful for you.  If you have pain with activities such as sitting, bending, stooping, or squatting, lie in a position in which your body does not bend very much. For example, avoid curling up on your side with your arms and knees near your chest (fetal position).  If you have pain with activities such as standing for a long time or reaching with your arms, lie with your spine in a neutral position and bend your knees slightly. Try the following  positions: ? Lying on your side with a pillow between your knees. ? Lying on your back with a pillow under your knees. Lifting   When lifting objects, keep your feet at least shoulder width apart and tighten your abdominal muscles.  Bend your knees and hips and keep your spine neutral. It is important to lift using the strength of your legs, not your back. Do not lock your knees straight out.  Always ask for help to lift heavy or awkward objects. This information is not intended to replace advice given to you by your health care provider. Make sure you discuss any questions you have with your health care provider. Document Revised: 11/07/2018 Document Reviewed: 08/07/2018 Elsevier Patient Education  2020 [employer].

## 2020-07-21 ENCOUNTER — Other Ambulatory Visit: Payer: Self-pay | Admitting: Physician Assistant

## 2020-07-21 DIAGNOSIS — E559 Vitamin D deficiency, unspecified: Secondary | ICD-10-CM

## 2020-10-06 ENCOUNTER — Other Ambulatory Visit: Payer: Self-pay | Admitting: Physician Assistant

## 2020-10-06 DIAGNOSIS — E559 Vitamin D deficiency, unspecified: Secondary | ICD-10-CM

## 2020-10-28 ENCOUNTER — Other Ambulatory Visit: Payer: Self-pay | Admitting: Physician Assistant

## 2020-10-28 DIAGNOSIS — E559 Vitamin D deficiency, unspecified: Secondary | ICD-10-CM

## 2020-12-09 ENCOUNTER — Ambulatory Visit: Payer: BC Managed Care – PPO | Admitting: Physician Assistant

## 2020-12-09 ENCOUNTER — Encounter: Payer: Self-pay | Admitting: Physician Assistant

## 2020-12-09 ENCOUNTER — Other Ambulatory Visit: Payer: Self-pay

## 2020-12-09 ENCOUNTER — Other Ambulatory Visit (HOSPITAL_COMMUNITY)
Admission: RE | Admit: 2020-12-09 | Discharge: 2020-12-09 | Disposition: A | Discharge status: Home / Self Care | Payer: BC Managed Care – PPO | Source: Ambulatory Visit | Attending: Physician Assistant | Admitting: Physician Assistant

## 2020-12-09 VITALS — BP 113/73 | HR 87 | Ht 68.0 in | Wt 204.0 lb

## 2020-12-09 DIAGNOSIS — E6609 Other obesity due to excess calories: Secondary | ICD-10-CM

## 2020-12-09 DIAGNOSIS — Z6830 Body mass index (BMI) 30.0-30.9, adult: Secondary | ICD-10-CM

## 2020-12-09 DIAGNOSIS — Z1329 Encounter for screening for other suspected endocrine disorder: Secondary | ICD-10-CM | POA: Diagnosis not present

## 2020-12-09 DIAGNOSIS — E559 Vitamin D deficiency, unspecified: Secondary | ICD-10-CM

## 2020-12-09 DIAGNOSIS — Z01419 Encounter for gynecological examination (general) (routine) without abnormal findings: Secondary | ICD-10-CM

## 2020-12-09 DIAGNOSIS — Z79899 Other long term (current) drug therapy: Secondary | ICD-10-CM

## 2020-12-09 DIAGNOSIS — Z131 Encounter for screening for diabetes mellitus: Secondary | ICD-10-CM | POA: Diagnosis not present

## 2020-12-09 DIAGNOSIS — J310 Chronic rhinitis: Secondary | ICD-10-CM

## 2020-12-09 DIAGNOSIS — Z0000 Encounter for general adult medical examination without abnormal findings: Secondary | ICD-10-CM | POA: Diagnosis not present

## 2020-12-09 DIAGNOSIS — Z13220 Encounter for screening for lipoid disorders: Secondary | ICD-10-CM | POA: Diagnosis not present

## 2020-12-09 DIAGNOSIS — Z1159 Encounter for screening for other viral diseases: Secondary | ICD-10-CM

## 2020-12-09 MED ORDER — MONTELUKAST SODIUM 10 MG PO TABS: 10.0000 mg | ORAL_TABLET | Freq: Every day | ORAL | 3 refills | Status: DC

## 2020-12-09 MED ORDER — PHENTERMINE HCL 37.5 MG PO TABS: 37.5000 mg | ORAL_TABLET | Freq: Every day | ORAL | 0 refills | Status: DC

## 2020-12-09 MED ORDER — VITAMIN D (ERGOCALCIFEROL) 1.25 MG (50000 UNIT) PO CAPS
50000.0000 [IU] | ORAL_CAPSULE | ORAL | 3 refills | Status: DC
Start: 2020-12-09 — End: 2021-11-23

## 2020-12-09 NOTE — Patient Instructions (Signed)
Health Maintenance, Female Adopting a healthy lifestyle and getting preventive care are important in promoting health and wellness. Ask your health care provider about:  The right schedule for you to have regular tests and exams.  Things you can do on your own to prevent diseases and keep yourself healthy. What should I know about diet, weight, and exercise? Eat a healthy diet  Eat a diet that includes plenty of vegetables, fruits, low-fat dairy products, and lean protein.  Do not eat a lot of foods that are high in solid fats, added sugars, or sodium.   Maintain a healthy weight Body mass index (BMI) is used to identify weight problems. It estimates body fat based on height and weight. Your health care provider can help determine your BMI and help you achieve or maintain a healthy weight. Get regular exercise Get regular exercise. This is one of the most important things you can do for your health. Most adults should:  Exercise for at least 150 minutes each week. The exercise should increase your heart rate and make you sweat (moderate-intensity exercise).  Do strengthening exercises at least twice a week. This is in addition to the moderate-intensity exercise.  Spend less time sitting. Even light physical activity can be beneficial. Watch cholesterol and blood lipids Have your blood tested for lipids and cholesterol at 56 years of age, then have this test every 5 years. Have your cholesterol levels checked more often if:  Your lipid or cholesterol levels are high.  You are older than 56 years of age.  You are at high risk for heart disease. What should I know about cancer screening? Depending on your health history and family history, you may need to have cancer screening at various ages. This may include screening for:  Breast cancer.  Cervical cancer.  Colorectal cancer.  Skin cancer.  Lung cancer. What should I know about heart disease, diabetes, and high blood  pressure? Blood pressure and heart disease  High blood pressure causes heart disease and increases the risk of stroke. This is more likely to develop in people who have high blood pressure readings, are of African descent, or are overweight.  Have your blood pressure checked: ? Every 3-5 years if you are 18-39 years of age. ? Every year if you are 40 years old or older. Diabetes Have regular diabetes screenings. This checks your fasting blood sugar level. Have the screening done:  Once every three years after age 40 if you are at a normal weight and have a low risk for diabetes.  More often and at a younger age if you are overweight or have a high risk for diabetes. What should I know about preventing infection? Hepatitis B If you have a higher risk for hepatitis B, you should be screened for this virus. Talk with your health care provider to find out if you are at risk for hepatitis B infection. Hepatitis C Testing is recommended for:  Everyone born from 1945 through 1965.  Anyone with known risk factors for hepatitis C. Sexually transmitted infections (STIs)  Get screened for STIs, including gonorrhea and chlamydia, if: ? You are sexually active and are younger than 56 years of age. ? You are older than 56 years of age and your health care provider tells you that you are at risk for this type of infection. ? Your sexual activity has changed since you were last screened, and you are at increased risk for chlamydia or gonorrhea. Ask your health care provider   if you are at risk.  Ask your health care provider about whether you are at high risk for HIV. Your health care provider may recommend a prescription medicine to help prevent HIV infection. If you choose to take medicine to prevent HIV, you should first get tested for HIV. You should then be tested every 3 months for as long as you are taking the medicine. Pregnancy  If you are about to stop having your period (premenopausal) and  you may become pregnant, seek counseling before you get pregnant.  Take 400 to 800 micrograms (mcg) of folic acid every day if you become pregnant.  Ask for birth control (contraception) if you want to prevent pregnancy. Osteoporosis and menopause Osteoporosis is a disease in which the bones lose minerals and strength with aging. This can result in bone fractures. If you are 65 years old or older, or if you are at risk for osteoporosis and fractures, ask your health care provider if you should:  Be screened for bone loss.  Take a calcium or vitamin D supplement to lower your risk of fractures.  Be given hormone replacement therapy (HRT) to treat symptoms of menopause. Follow these instructions at home: Lifestyle  Do not use any products that contain nicotine or tobacco, such as cigarettes, e-cigarettes, and chewing tobacco. If you need help quitting, ask your health care provider.  Do not use street drugs.  Do not share needles.  Ask your health care provider for help if you need support or information about quitting drugs. Alcohol use  Do not drink alcohol if: ? Your health care provider tells you not to drink. ? You are pregnant, may be pregnant, or are planning to become pregnant.  If you drink alcohol: ? Limit how much you use to 0-1 drink a day. ? Limit intake if you are breastfeeding.  Be aware of how much alcohol is in your drink. In the [place] one drink equals one 12 oz bottle of beer (355 mL), one 5 oz glass of wine (148 mL), or one 1 oz glass of hard liquor (44 mL). General instructions  Schedule regular health, dental, and eye exams.  Stay current with your vaccines.  Tell your health care provider if: ? You often feel depressed. ? You have ever been abused or do not feel safe at home. Summary  Adopting a healthy lifestyle and getting preventive care are important in promoting health and wellness.  Follow your health care provider's instructions about healthy  diet, exercising, and getting tested or screened for diseases.  Follow your health care provider's instructions on monitoring your cholesterol and blood pressure. This information is not intended to replace advice given to you by your health care provider. Make sure you discuss any questions you have with your health care provider. Document Revised: 07/09/2018 Document Reviewed: 07/09/2018 Elsevier Patient Education  2021 [employer]  

## 2020-12-09 NOTE — Progress Notes (Signed)
Subjective:     Bethany Murillo is a 55 y.o. female and is here for a comprehensive physical exam. The patient reports problems - wants to have help losing weight. phentermine is the only thing that helps. .  Social History   Socioeconomic History  . Marital status: Married    Spouse name: Not on file  . Number of children: Not on file  . Years of education: Not on file  . Highest education level: Not on file  Occupational History  . 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 Smoking status: Never Smoker  . Smokeless tobacco: Never Used  Substance and Sexual Activity  . Alcohol use: No  . Drug use: No  . Sexual activity: Yes  Other Topics Concern  . Not on file  Social History Narrative  . Not on file   Social Determinants of Health   Financial Resource Strain: Not on file  Food Insecurity: Not on file  Transportation Needs: Not on file  Physical Activity: Not on file  Stress: Not on file  Social Connections: Not on file  Intimate Partner Violence: Not on file   Health Maintenance  Topic Date Due  . Hepatitis C Screening  Never done  . PAP SMEAR-Modifier  08/14/2018  . COVID-19 Vaccine (1) 12/25/2020 (Originally 05/03/1971)  . HIV Screening  02/16/2021 (Originally 05/03/1980)  . MAMMOGRAM  12/09/2021 (Originally 09/23/2020)  . INFLUENZA VACCINE  02/27/2021  . TETANUS/TDAP  12/29/2023  . COLONOSCOPY (Pts 45-67yrs Insurance coverage will need to be confirmed)  08/29/2025  . HPV VACCINES  Aged Out    The following portions of the patient's history were reviewed and updated as appropriate: allergies, current medications, past family history, past medical history, past social history, past surgical history and problem list.  Review of Systems A comprehensive review of systems was negative.   Objective:    BP 113/73   Pulse 87   Ht 5\' 8"  (1.727 m)   Wt 204 lb (92.5 kg)   LMP 05/12/2014 (Approximate)   SpO2 100%   BMI 31.02 kg/m   General appearance: alert, cooperative and appears stated age Head: Normocephalic, without obvious abnormality, atraumatic Eyes: conjunctivae/corneas clear. PERRL, EOM's intact. Fundi benign. Ears: normal TM's and external ear canals both ears Nose: Nares normal. Septum midline. Mucosa normal. No drainage or sinus tenderness. Throat: lips, mucosa, and tongue normal; teeth and gums normal Neck: no adenopathy, no carotid bruit, no JVD, supple, symmetrical, trachea midline and thyroid not enlarged, symmetric, no tenderness/mass/nodules Back: symmetric, no curvature. ROM normal. No CVA tenderness. Lungs: clear to auscultation bilaterally Heart: regular rate and rhythm, S1, S2 normal, no murmur, click, rub or gallop Abdomen: soft, non-tender; bowel sounds normal; no masses,  no organomegaly Pelvic: cervix normal in appearance, external genitalia normal, no adnexal masses or tenderness, no cervical motion tenderness, rectovaginal septum normal, uterus normal size, shape, and consistency and vagina normal without discharge Extremities: extremities normal, atraumatic, no cyanosis or edema Pulses: 2+ and symmetric Skin: Skin color, texture, turgor normal. No rashes or lesions Lymph nodes: Cervical, supraclavicular, and axillary nodes normal. Neurologic: Alert and oriented X 3, normal strength and tone. Normal symmetric reflexes. Normal coordination and gait   .05/14/2014 Depression screen [facility] 2/9 02/17/2020 08/24/2019 01/17/2018  Decreased Interest 0 0 0  Down, Depressed, Hopeless 0 0 1  PHQ - 2 Score 0 0 1  Altered sleeping 3 1 1   Tired, decreased energy 1 0  3  Change in appetite 3 1 2   Feeling bad or failure about yourself  0 0 0  Trouble concentrating 0 0 0  Moving slowly or fidgety/restless 0 0 0  Suicidal thoughts 0 0 -  PHQ-9 Score 7 2 7   Difficult doing work/chores Somewhat difficult Not difficult at all Somewhat difficult   . GAD 7 : Generalized Anxiety Score 02/17/2020 08/24/2019 01/17/2018   Nervous, Anxious, on Edge 0 0 0  Control/stop worrying 0 0 0  Worry too much - different things 1 1 1   Trouble relaxing 0 0 1  Restless 0 0 0  Easily annoyed or irritable 2 2 1   Afraid - awful might happen 0 0 -  Total GAD 7 Score 3 3 -  Anxiety Difficulty Somewhat difficult Somewhat difficult Not difficult at all     Assessment:    Healthy female exam.      Plan:    1/27/20216/23/2019[name] was seen today for follow-up.  Diagnoses and all orders for this visit:  Routine physical examination  Screening for diabetes mellitus -     COMPLETE METABOLIC PANEL WITH GFR  Screening for lipid disorders -     Lipid Panel w/reflex Direct LDL  Screening for thyroid disorder -     TSH  Medication management -     COMPLETE METABOLIC PANEL WITH GFR -     CBC with Differential/Platelet -     Lipid Panel w/reflex Direct LDL -     TSH -     VITAMIN D 25 Hydroxy (Vit-D Deficiency, Fractures)  Vitamin D deficiency -     VITAMIN D 25 Hydroxy (Vit-D Deficiency, Fractures) -     Vitamin D, Ergocalciferol, (DRISDOL) 1.25 MG (50000 UNIT) CAPS capsule; Take 1 capsule (50,000 Units total) by mouth every 7 (seven) days.  Chronic rhinitis -     montelukast (SINGULAIR) 10 MG tablet; Take 1 tablet (10 mg total) by mouth at bedtime.  Pap smear, as part of routine gynecological examination -     Cytology - PAP  Encounter for hepatitis C screening test for low risk patient -     Hepatitis C Antibody  Class 1 obesity due to excess calories without serious comorbidity with body mass index (BMI) of 30.0 to 30.9 in adult  Other orders -     Discontinue: phentermine (ADIPEX-P) 37.5 MG tablet; Take 1 tablet (37.5 mg total) by mouth daily before breakfast. -     phentermine (ADIPEX-P) 37.5 MG tablet; Take 1 tablet (37.5 mg total) by mouth daily before breakfast.   .. Discussed 150 minutes of exercise a week.  Encouraged vitamin D 1000 units and Calcium 1300mg  or 4 servings of dairy a day.  Fasting labs  ordered.  Declined mammogram until next year.  Colonoscopy UTD.  Pap done today. Declined sTD.  Declined covid/flu/shingles.    .Discussed low carb diet with 1500 calories and 80g of protein.  Exercising at least 150 minutes a week.  My Fitness Pal could be a Marland Kitchen.  Discussed half phentermine daily.   See After Visit Summary for Counseling Recommendations

## 2020-12-12 DIAGNOSIS — Z13220 Encounter for screening for lipoid disorders: Secondary | ICD-10-CM | POA: Diagnosis not present

## 2020-12-12 DIAGNOSIS — Z79899 Other long term (current) drug therapy: Secondary | ICD-10-CM | POA: Diagnosis not present

## 2020-12-12 DIAGNOSIS — E559 Vitamin D deficiency, unspecified: Secondary | ICD-10-CM | POA: Diagnosis not present

## 2020-12-12 DIAGNOSIS — Z131 Encounter for screening for diabetes mellitus: Secondary | ICD-10-CM | POA: Diagnosis not present

## 2020-12-13 LAB — CBC WITH DIFFERENTIAL/PLATELET
Absolute Monocytes: 462 cells/uL (ref 200–950)
Basophils Absolute: 48 cells/uL (ref 0–200)
Basophils Relative: 0.7 %
Eosinophils Absolute: 211 cells/uL (ref 15–500)
Eosinophils Relative: 3.1 %
HCT: 36.3 % (ref 35.0–45.0)
Hemoglobin: 11.9 g/dL (ref 11.7–15.5)
Lymphs Abs: 3006 cells/uL (ref 850–3900)
MCH: 28.1 pg (ref 27.0–33.0)
MCHC: 32.8 g/dL (ref 32.0–36.0)
MCV: 85.6 fL (ref 80.0–100.0)
MPV: 12.5 fL (ref 7.5–12.5)
Monocytes Relative: 6.8 %
Neutro Abs: 3074 cells/uL (ref 1500–7800)
Neutrophils Relative %: 45.2 %
Platelets: 213 10*3/uL (ref 140–400)
RBC: 4.24 10*6/uL (ref 3.80–5.10)
RDW: 13.7 % (ref 11.0–15.0)
Total Lymphocyte: 44.2 %
WBC: 6.8 10*3/uL (ref 3.8–10.8)

## 2020-12-13 LAB — HEPATITIS C ANTIBODY
Hepatitis C Ab: NONREACTIVE
SIGNAL TO CUT-OFF: 0.01 (ref <1.00)

## 2020-12-13 LAB — LIPID PANEL W/REFLEX DIRECT LDL
Cholesterol: 166 mg/dL (ref <200)
HDL: 51 mg/dL (ref >=50)
LDL Cholesterol (Calc): 98 mg/dL (calc)
Non-HDL Cholesterol (Calc): 115 mg/dL (calc) (ref <130)
Total CHOL/HDL Ratio: 3.3 (calc) (ref <5.0)
Triglycerides: 78 mg/dL (ref <150)

## 2020-12-13 LAB — VITAMIN D 25 HYDROXY (VIT D DEFICIENCY, FRACTURES): Vit D, 25-Hydroxy: 49 ng/mL (ref 30–100)

## 2020-12-13 LAB — COMPLETE METABOLIC PANEL WITH GFR
AG Ratio: 1.6 (calc) (ref 1.0–2.5)
ALT: 23 U/L (ref 6–29)
AST: 27 U/L (ref 10–35)
Albumin: 4.5 g/dL (ref 3.6–5.1)
Alkaline phosphatase (APISO): 50 U/L (ref 37–153)
BUN: 10 mg/dL (ref 7–25)
CO2: 26 mmol/L (ref 20–32)
Calcium: 9.9 mg/dL (ref 8.6–10.4)
Chloride: 109 mmol/L (ref 98–110)
Creat: 0.91 mg/dL (ref 0.50–1.05)
GFR, Est African American: 82 mL/min/{1.73_m2} (ref >=60)
GFR, Est Non African American: 71 mL/min/{1.73_m2} (ref >=60)
Globulin: 2.8 g/dL (calc) (ref 1.9–3.7)
Glucose, Bld: 97 mg/dL (ref 65–99)
Potassium: 4.5 mmol/L (ref 3.5–5.3)
Sodium: 143 mmol/L (ref 135–146)
Total Bilirubin: 0.5 mg/dL (ref 0.2–1.2)
Total Protein: 7.3 g/dL (ref 6.1–8.1)

## 2020-12-13 LAB — TSH: TSH: 1.62 mIU/L

## 2020-12-13 NOTE — Progress Notes (Signed)
Casandra,   Kidney, liver, glucose look great.  No anemia.  Cholesterol looks fantastic.  Thyroid perfect.  Vitamin D much better!!! Continue vitamin D supplementation!

## 2020-12-14 LAB — CYTOLOGY - PAP
Comment: NEGATIVE
Diagnosis: NEGATIVE
High risk HPV: NEGATIVE

## 2020-12-14 NOTE — Progress Notes (Signed)
No HPV. Normal cells. Follow up in 5 years.

## 2021-05-19 ENCOUNTER — Ambulatory Visit: Payer: BC Managed Care – PPO | Admitting: Physician Assistant

## 2021-05-24 ENCOUNTER — Ambulatory Visit: Payer: BC Managed Care – PPO | Admitting: Physician Assistant

## 2021-05-24 VITALS — BP 118/76 | HR 69 | Ht 68.0 in | Wt 206.0 lb

## 2021-05-24 DIAGNOSIS — T39395A Adverse effect of other nonsteroidal anti-inflammatory drugs [NSAID], initial encounter: Secondary | ICD-10-CM

## 2021-05-24 DIAGNOSIS — B3731 Acute candidiasis of vulva and vagina: Secondary | ICD-10-CM | POA: Diagnosis not present

## 2021-05-24 DIAGNOSIS — K2960 Other gastritis without bleeding: Secondary | ICD-10-CM

## 2021-05-24 DIAGNOSIS — K5909 Other constipation: Secondary | ICD-10-CM

## 2021-05-24 DIAGNOSIS — R10816 Epigastric abdominal tenderness: Secondary | ICD-10-CM | POA: Diagnosis not present

## 2021-05-24 MED ORDER — FLUCONAZOLE 150 MG PO TABS: 150.0000 mg | ORAL_TABLET | Freq: Every day | ORAL | 0 refills | Status: DC

## 2021-05-24 NOTE — Progress Notes (Signed)
Subjective:    Patient ID: Bethany Murillo, female    DOB: 02-Oct-1965, 55 y.o.   MRN: [id number]  HPI Pt is a 55 yo female with hx of cholecystectomy and chronic constipation who presents to the clinic with epigastric pain and nausea for the last 3 weeks. It does seem to be feeling better now. She always has constipation and uses mag citrate when needed as well as foods to help her go regularly. She has been going about once a day. She denies any melena or hematochezia, dsyuria. She tried omeprazole and seems to be helping some. She admits to taking more ibuprofen recently for shoulder pain. She did stop about 1 week ago. She noticed almost felt better when she ate.    .. Active Ambulatory Problems    Diagnosis Date Noted   Vitamin D deficiency 01/14/2013   Cough 12/24/2013   Allergic rhinitis 12/24/2013   Eyelid edema 12/30/2013   Gallstones and inflammation of gallbladder without obstruction 06/29/2014   Class 1 obesity due to excess calories without serious comorbidity with body mass index (BMI) of 30.0 to 30.9 in adult 08/15/2015   Right knee pain 08/15/2015   Upper airway cough syndrome 05/23/2016   Overweight (BMI 25.0-29.9) 01/17/2018   Hot flashes, menopausal 08/25/2019   Persistent cough 02/17/2020   Ankle swelling 02/29/2020   Acute left-sided low back pain without sciatica 06/28/2020   Epigastric abdominal tenderness without rebound tenderness 05/25/2021   NSAID induced gastritis 05/25/2021   Resolved Ambulatory Problems    Diagnosis Date Noted   Asthmatic bronchitis 11/13/2013   Past Medical History:  Diagnosis Date   Unspecified vitamin D deficiency 01/14/2013       Review of Systems See HPI.     Objective:   Physical Exam Vitals reviewed.  Constitutional:      Appearance: She is well-developed.  HENT:     Head: Normocephalic.  Cardiovascular:     Rate and Rhythm: Normal rate and regular rhythm.  Pulmonary:     Effort: Pulmonary effort is normal.      Breath sounds: Normal breath sounds.  Abdominal:     General: Bowel sounds are normal. There is no distension or abdominal bruit.     Palpations: Abdomen is soft.     Tenderness: There is generalized abdominal tenderness and tenderness in the epigastric area. There is no right CVA tenderness, left CVA tenderness, guarding or rebound. Negative signs include Murphy's sign, Rovsing's sign, McBurney's sign, psoas sign and obturator sign.     Hernia: No hernia is present.  Neurological:     Mental Status: She is alert.  Psychiatric:        Mood and Affect: Mood normal.          Assessment & Plan:  Marland KitchenMarland KitchenCandie was seen today for abdominal pain.  Diagnoses and all orders for this visit:  NSAID induced gastritis -     CBC with Differential/Platelet -     COMPLETE METABOLIC PANEL WITH GFR -     Lipase  Epigastric abdominal tenderness without rebound tenderness  Vaginal yeast infection -     fluconazole (DIFLUCAN) 150 MG tablet; Take 1 tablet (150 mg total) by mouth daily. Repeat as symptoms persist times 3.  Pain mostly resolved after stopping ibuprofen.  Suspect NSAID induced gastritis ?duodenal ulcer. Cbc,cmp,lipase ordered.  Discussed diet that can help the stomach continue to heal.  Continue omeprazole for next month.  Consider probiotic daily for gut health.  Follow up as  needed.

## 2021-05-24 NOTE — Patient Instructions (Signed)
Likely NSAID induced.  Start probiotic daily for gut health.   Gastritis, Adult Gastritis is inflammation of the stomach. There are two kinds of gastritis: Acute gastritis. This kind develops suddenly. Chronic gastritis. This kind is much more common and lasts for a long time. Gastritis happens when the lining of the stomach becomes weak or gets damaged. Without treatment, gastritis can lead to stomach bleeding and ulcers. What are the causes? This condition may be caused by: An infection. Drinking too much alcohol. Certain medicines. These include steroids, antibiotics, and some over-the-counter medicines, such as aspirin or ibuprofen. Having too much acid in the stomach. A disease of the intestines or stomach. Stress. An allergic reaction. Crohn's disease. Some cancer treatments (radiation). Sometimes the cause of this condition is not known. What are the signs or symptoms? Symptoms of this condition include: Pain or a burning sensation in the upper abdomen. Nausea. Vomiting. An uncomfortable feeling of fullness after eating. Weight loss. Bad breath. Blood in your vomit or stools. In some cases, there are no symptoms. How is this diagnosed? This condition may be diagnosed with: Your medical history and a description of your symptoms. A physical exam. Tests. These can include: Blood tests. Stool tests. A test in which a thin, flexible instrument with a light and a camera is passed down the esophagus and into the stomach (upper endoscopy). A test in which a sample of tissue is taken for testing (biopsy). How is this treated? This condition may be treated with medicines. The medicines that are used vary depending on the cause of the gastritis: If the condition is caused by a bacterial infection, you may be given antibiotic medicines. If the condition is caused by too much acid in the stomach, you may be given medicines called H2 blockers, proton pump inhibitors, or  antacids. Treatment may also involve stopping the use of certain medicines, such as aspirin, ibuprofen, or other NSAIDs. Follow these instructions at home: Medicines Take over-the-counter and prescription medicines only as told by your health care provider. If you were prescribed an antibiotic medicine, take it as told by your health care provider. Do not stop taking the antibiotic even if you start to feel better. Eating and drinking  Eat small, frequent meals instead of large meals. Avoid foods and drinks that make your symptoms worse. Drink enough fluid to keep your urine pale yellow. Alcohol use Do not drink alcohol if: Your health care provider tells you not to drink. You are pregnant, may be pregnant, or are planning to become pregnant. If you drink alcohol: Limit your use to: 0-1 drink a day for women. 0-2 drinks a day for men. Be aware of how much alcohol is in your drink. In the [place] one drink equals one 12 oz bottle of beer (355 mL), one 5 oz glass of wine (148 mL), or one 1 oz glass of hard liquor (44 mL). General instructions Talk with your health care provider about ways to manage stress, such as getting regular exercise or practicing deep breathing, meditation, or yoga. Do not use any products that contain nicotine or tobacco, such as cigarettes and e-cigarettes. If you need help quitting, ask your health care provider. Keep all follow-up visits as told by your health care provider. This is important. Contact a health care provider if: Your symptoms get worse. Your symptoms return after treatment. Get help right away if: You vomit blood or material that looks like coffee grounds. You have black or dark red stools. You are  unable to keep fluids down. Your abdominal pain gets worse. You have a fever. You do not feel better after one week. Summary Gastritis is inflammation of the lining of the stomach that can occur suddenly (acute) or develop slowly over time  (chronic). This condition is diagnosed with a medical history, a physical exam, or tests. This condition may be treated with medicines to treat infection or medicines to reduce the amount of acid in your stomach. Follow your health care provider's instructions about taking medicines, making changes to your diet, and knowing when to call for help. This information is not intended to replace advice given to you by your health care provider. Make sure you discuss any questions you have with your health care provider. Document Revised: 12/03/2017 Document Reviewed: 12/03/2017 Elsevier Patient Education  2022 [employer].

## 2021-05-25 ENCOUNTER — Encounter: Payer: Self-pay | Admitting: Physician Assistant

## 2021-05-25 DIAGNOSIS — K5909 Other constipation: Secondary | ICD-10-CM | POA: Insufficient documentation

## 2021-05-25 DIAGNOSIS — R10816 Epigastric abdominal tenderness: Secondary | ICD-10-CM | POA: Insufficient documentation

## 2021-05-25 DIAGNOSIS — K2960 Other gastritis without bleeding: Secondary | ICD-10-CM | POA: Insufficient documentation

## 2021-05-25 LAB — CBC WITH DIFFERENTIAL/PLATELET
Absolute Monocytes: 376 cells/uL (ref 200–950)
Basophils Absolute: 53 cells/uL (ref 0–200)
Basophils Relative: 0.8 %
Eosinophils Absolute: 158 cells/uL (ref 15–500)
Eosinophils Relative: 2.4 %
HCT: 37.5 % (ref 35.0–45.0)
Hemoglobin: 12.1 g/dL (ref 11.7–15.5)
Lymphs Abs: 2930 cells/uL (ref 850–3900)
MCH: 27.4 pg (ref 27.0–33.0)
MCHC: 32.3 g/dL (ref 32.0–36.0)
MCV: 85 fL (ref 80.0–100.0)
MPV: 12.5 fL (ref 7.5–12.5)
Monocytes Relative: 5.7 %
Neutro Abs: 3082 cells/uL (ref 1500–7800)
Neutrophils Relative %: 46.7 %
Platelets: 229 10*3/uL (ref 140–400)
RBC: 4.41 10*6/uL (ref 3.80–5.10)
RDW: 13.5 % (ref 11.0–15.0)
Total Lymphocyte: 44.4 %
WBC: 6.6 10*3/uL (ref 3.8–10.8)

## 2021-05-25 LAB — COMPLETE METABOLIC PANEL WITH GFR
AG Ratio: 1.6 (calc) (ref 1.0–2.5)
ALT: 22 U/L (ref 6–29)
AST: 24 U/L (ref 10–35)
Albumin: 4.2 g/dL (ref 3.6–5.1)
Alkaline phosphatase (APISO): 54 U/L (ref 37–153)
BUN: 11 mg/dL (ref 7–25)
CO2: 26 mmol/L (ref 20–32)
Calcium: 9.2 mg/dL (ref 8.6–10.4)
Chloride: 108 mmol/L (ref 98–110)
Creat: 1 mg/dL (ref 0.50–1.03)
Globulin: 2.6 g/dL (calc) (ref 1.9–3.7)
Glucose, Bld: 97 mg/dL (ref 65–99)
Potassium: 4.4 mmol/L (ref 3.5–5.3)
Sodium: 142 mmol/L (ref 135–146)
Total Bilirubin: 0.4 mg/dL (ref 0.2–1.2)
Total Protein: 6.8 g/dL (ref 6.1–8.1)
eGFR: 66 mL/min/{1.73_m2} (ref >=60)

## 2021-05-25 LAB — LIPASE: Lipase: 30 U/L (ref 7–60)

## 2021-05-25 NOTE — Progress Notes (Signed)
Labs look great.  No sign of pancreatic inflammation or blood loss.  Kidney and liver look great.

## 2021-09-12 ENCOUNTER — Other Ambulatory Visit: Payer: Self-pay

## 2021-09-12 ENCOUNTER — Ambulatory Visit: Payer: BC Managed Care – PPO | Admitting: Physician Assistant

## 2021-09-12 ENCOUNTER — Encounter: Payer: Self-pay | Admitting: Physician Assistant

## 2021-09-12 VITALS — BP 130/86 | HR 84 | Ht 68.0 in | Wt 203.0 lb

## 2021-09-12 DIAGNOSIS — R1084 Generalized abdominal pain: Secondary | ICD-10-CM

## 2021-09-12 DIAGNOSIS — K5909 Other constipation: Secondary | ICD-10-CM | POA: Diagnosis not present

## 2021-09-12 MED ORDER — LINACLOTIDE 290 MCG PO CAPS
290.0000 ug | ORAL_CAPSULE | Freq: Every day | ORAL | 1 refills | Status: DC
Start: 2021-09-12 — End: 2022-04-24

## 2021-09-12 NOTE — Patient Instructions (Signed)
Chronic Constipation Chronic constipation is a condition in which a person has three or fewer bowel movements a week, for 3 months or longer. This condition is especially commonin older adults. What are the causes? Causes of chronic constipation may include: Not drinking enough fluid, eating enough food or fiber, or getting enough physical activity. Pregnancy. A tear in the anus (anal fissure). Blockage in the bowel (bowel obstruction). Narrowing of the bowel (bowel stricture). Having a long-term medical condition, such as: Diabetes, hypothyroidism, or iron-deficiency anemia. Stroke or spinal cord injury. Multiple sclerosis or Parkinson's disease. Colon cancer. Dementia. Inflammatory bowel disease (IBD), outward collapse of the rectum (rectal prolapse), or hemorrhoids. Taking certain medicines, including: Narcotics. These are a certain type of prescription pain medicine. Antacids or iron supplements. Water pills (diuretics). Certain blood pressure medicines. Anti-seizure medicines. Antidepressants. Medicines for Parkinson's disease. Other causes of this condition may include: Stress. Problems in the nerves and muscles that control the movement of stool. Weak or impaired pelvic floor muscles. What increases the risk? You may be at higher risk for chronic constipation if: You are older than age 70. You are female. You live in a long-term care facility. You have a long-term disease. You have a mental health disorder or eating disorder. What are the signs or symptoms? The main symptom of chronic constipation is having three or fewer bowel movements a week for several weeks. Other signs and symptoms may vary from person to person. These include: Pushing hard (straining) to pass stool, or having hard or lumpy stools. Painful bowel movements. Having lower abdominal discomfort, such as cramps or bloating. Being unable to have a bowel movement when you feel the urge, or feeling like  you still need to pass stool after a bowel movement. Feeling that you have something in your rectum that is blocking or preventing bowel movements. Seeing blood on the toilet paper or in your stool. Worsening confusion (in older adults). How is this diagnosed? This condition may be diagnosed based on: Your symptoms and medical history. You will be asked about your symptoms, lifestyle, diet, and any medicines that you are taking. A physical exam. Your abdomen will be examined. A digital rectal exam may be done. For this exam, a health care provider places a lubricated, gloved finger into the rectum. Tests to check for any underlying causes of your constipation. These may be ordered if you have bleeding in your rectum, weight loss, or a family history of colon cancer. In these cases, you may have: Imaging studies of the colon. These may include X-ray, ultrasound, or a CT scan. Blood tests. A procedure to examine the inside of your colon (colonoscopy). More specialized tests to check: Whether your anal sphincter works well. This is a ring-shaped muscle that controls the closing of the anus. How well food moves through your colon. Tests to measure the nerve signal in your pelvic floor muscles (electromyography). How is this treated? Treatment for chronic constipation depends on the cause. Most often, treatment starts with: Being more active and getting regular exercise. Drinking more fluids. Adding fiber to your diet. Sources of fiber include fruits, vegetables, whole grains, and fiber supplements. Using medicines such as stool softeners or medicines that increase contractions in your digestive system (pro-motility agents). Training your pelvic muscles with biofeedback. Surgery, if there is obstruction. Treatment may also include: Stopping or changing some medicines if they cause constipation. Using a fiber supplement (bulk laxative) or stool softener. Using a prescription laxative. This  works by absorbing water   into your colon (osmotic laxative). You may also need to see a specialist who treats conditions of the digestive system (gastroenterologist). Follow these instructions at home: Medicines Take over-the-counter and prescription medicines only as told by your health care provider. If you are taking a laxative, take it as told by your health care provider. Eating and drinking  Eat a balanced diet that includes enough fiber. Ask your health care provider to recommend a diet that is right for you. Drink clear fluids, especially water. Avoid drinking alcohol, caffeine, and soda. These can make constipation worse. Drink enough fluid to keep your urine pale yellow.  General instructions Get some physical activity every day. Ask your health care provider what activities are safe for you. Get colon cancer screenings as told by your health care provider. Keep all follow-up visits as told by your health care provider. This is important. Contact a health care provider if you have: Three or fewer bowel movements a week. Stools that are hard or lumpy. Blood on the toilet paper or in your stool after you have a bowel movement. Unexplained weight loss. Rectum (rectal) pain. Stool leakage. Nausea or vomiting. Get help right away if you have: Rectal bleeding or you pass blood clots. Severe rectal pain. Body tissue that pushes out (protrudes) from your anus. Severe pain or bloating (distension) in your abdomen. Vomiting that you cannot control. Summary Chronic constipation is a condition in which a person has three or fewer bowel movements a week, for 3 months or longer. You may have a higher risk for this condition if you are an older adult, you are female, or you have a long-term disease. Treatment for this condition depends on the cause. Most treatments for chronic constipation include adding fiber to your diet, drinking more fluids, and getting more physical activity. You may  also need to treat any underlying medical conditions or stop or change certain medicines if they cause constipation. If lifestyle changes do not relieve constipation, your health care provider may recommend taking a laxative. This information is not intended to replace advice given to you by your health care provider. Make sure you discuss any questions you have with your healthcare provider. Document Revised: 06/03/2019 Document Reviewed: 06/03/2019 Elsevier Patient Education  2022 [employer]  

## 2021-09-12 NOTE — Progress Notes (Signed)
Subjective:    Patient ID: Bethany Murillo, female    DOB: January 26, 1966, 56 y.o.   MRN: [id number]  Abdominal Pain Associated symptoms include constipation. Pertinent negatives include no diarrhea, dysuria, fever, nausea or vomiting.  The patient is a well appearing 56 year old female with history pertinent for chronic constipation presenting with a chief complaint of intermittent RLQ abdominal pain for 2 months. She describes this pain as "achey" with a severity of 6/10 at its worst. She denies any alleviating factors, but notes she notices the pain more while lying in bed. She has not used any OTC medications such as MiraLax. Previously she was experiencing daily bowel movements but they have been more infrequent over the past 2 months, occurring once every three days. Last bowel movement was 2 days ago. Pt has had no recent illness. She denies any nausea and vomiting.   .. Active Ambulatory Problems    Diagnosis Date Noted   Vitamin D deficiency 01/14/2013   Cough 12/24/2013   Allergic rhinitis 12/24/2013   Eyelid edema 12/30/2013   Gallstones and inflammation of gallbladder without obstruction 06/29/2014   Class 1 obesity due to excess calories without serious comorbidity with body mass index (BMI) of 30.0 to 30.9 in adult 08/15/2015   Right knee pain 08/15/2015   Upper airway cough syndrome 05/23/2016   Overweight (BMI 25.0-29.9) 01/17/2018   Hot flashes, menopausal 08/25/2019   Persistent cough 02/17/2020   Ankle swelling 02/29/2020   Acute left-sided low back pain without sciatica 06/28/2020   Epigastric abdominal tenderness without rebound tenderness 05/25/2021   NSAID induced gastritis 05/25/2021   Constipation, chronic 05/25/2021   Generalized abdominal pain 09/12/2021   Resolved Ambulatory Problems    Diagnosis Date Noted   Asthmatic bronchitis 11/13/2013   Past Medical History:  Diagnosis Date   Unspecified vitamin D deficiency 01/14/2013      Review of Systems   Constitutional: Negative.  Negative for chills, fatigue and fever.  Respiratory: Negative.  Negative for chest tightness, shortness of breath and wheezing.   Gastrointestinal:  Positive for abdominal pain and constipation. Negative for blood in stool, diarrhea, nausea and vomiting.  Genitourinary:  Negative for difficulty urinating, dysuria and flank pain.      Objective:   Physical Exam Constitutional:      Appearance: She is well-developed. She is obese.  HENT:     Head: Normocephalic and atraumatic.  Cardiovascular:     Rate and Rhythm: Normal rate and regular rhythm.     Heart sounds: Normal heart sounds. No murmur heard.   No friction rub. No gallop.  Pulmonary:     Effort: Pulmonary effort is normal.     Breath sounds: Normal breath sounds. No wheezing.  Abdominal:     General: Abdomen is protuberant. A surgical scar is present. Bowel sounds are decreased. There are no signs of injury.     Tenderness: There is generalized abdominal tenderness.     Hernia: No hernia is present.  Neurological:     Mental Status: She is alert.  Psychiatric:        Mood and Affect: Mood normal.        Behavior: Behavior normal.          Assessment & Plan:  Marland KitchenMarland KitchenChaise was seen today for abdominal pain.  Diagnoses and all orders for this visit:  Constipation, chronic -     linaclotide (LINZESS) 290 MCG CAPS capsule; Take 1 capsule (290 mcg total) by mouth daily.  Generalized abdominal pain -     linaclotide (LINZESS) 290 MCG CAPS capsule; Take 1 capsule (290 mcg total) by mouth daily.   No red flags today.  Discussed bowel clean out and gave HO.  Start linzess daily. Discussed side effects.  Coupon card given. Follow up in 3 weeks or sooner if needed or if pain changes or worsens.  Discussed constipation diet.

## 2021-10-13 ENCOUNTER — Encounter: Payer: Self-pay | Admitting: Physician Assistant

## 2021-10-13 MED ORDER — METHYLPREDNISOLONE 4 MG PO TBPK: ORAL_TABLET | ORAL | 0 refills | Status: DC

## 2021-11-23 ENCOUNTER — Other Ambulatory Visit: Payer: Self-pay | Admitting: Physician Assistant

## 2021-11-23 DIAGNOSIS — E559 Vitamin D deficiency, unspecified: Secondary | ICD-10-CM

## 2021-12-05 IMAGING — DX DG CHEST 2V
2 series · 2 of 2 positions shown · non-contrast
Comparison: 01/28/2013

CLINICAL DATA: Cough for 2 months

EXAM:
CHEST - 2 VIEW

[chest pa]
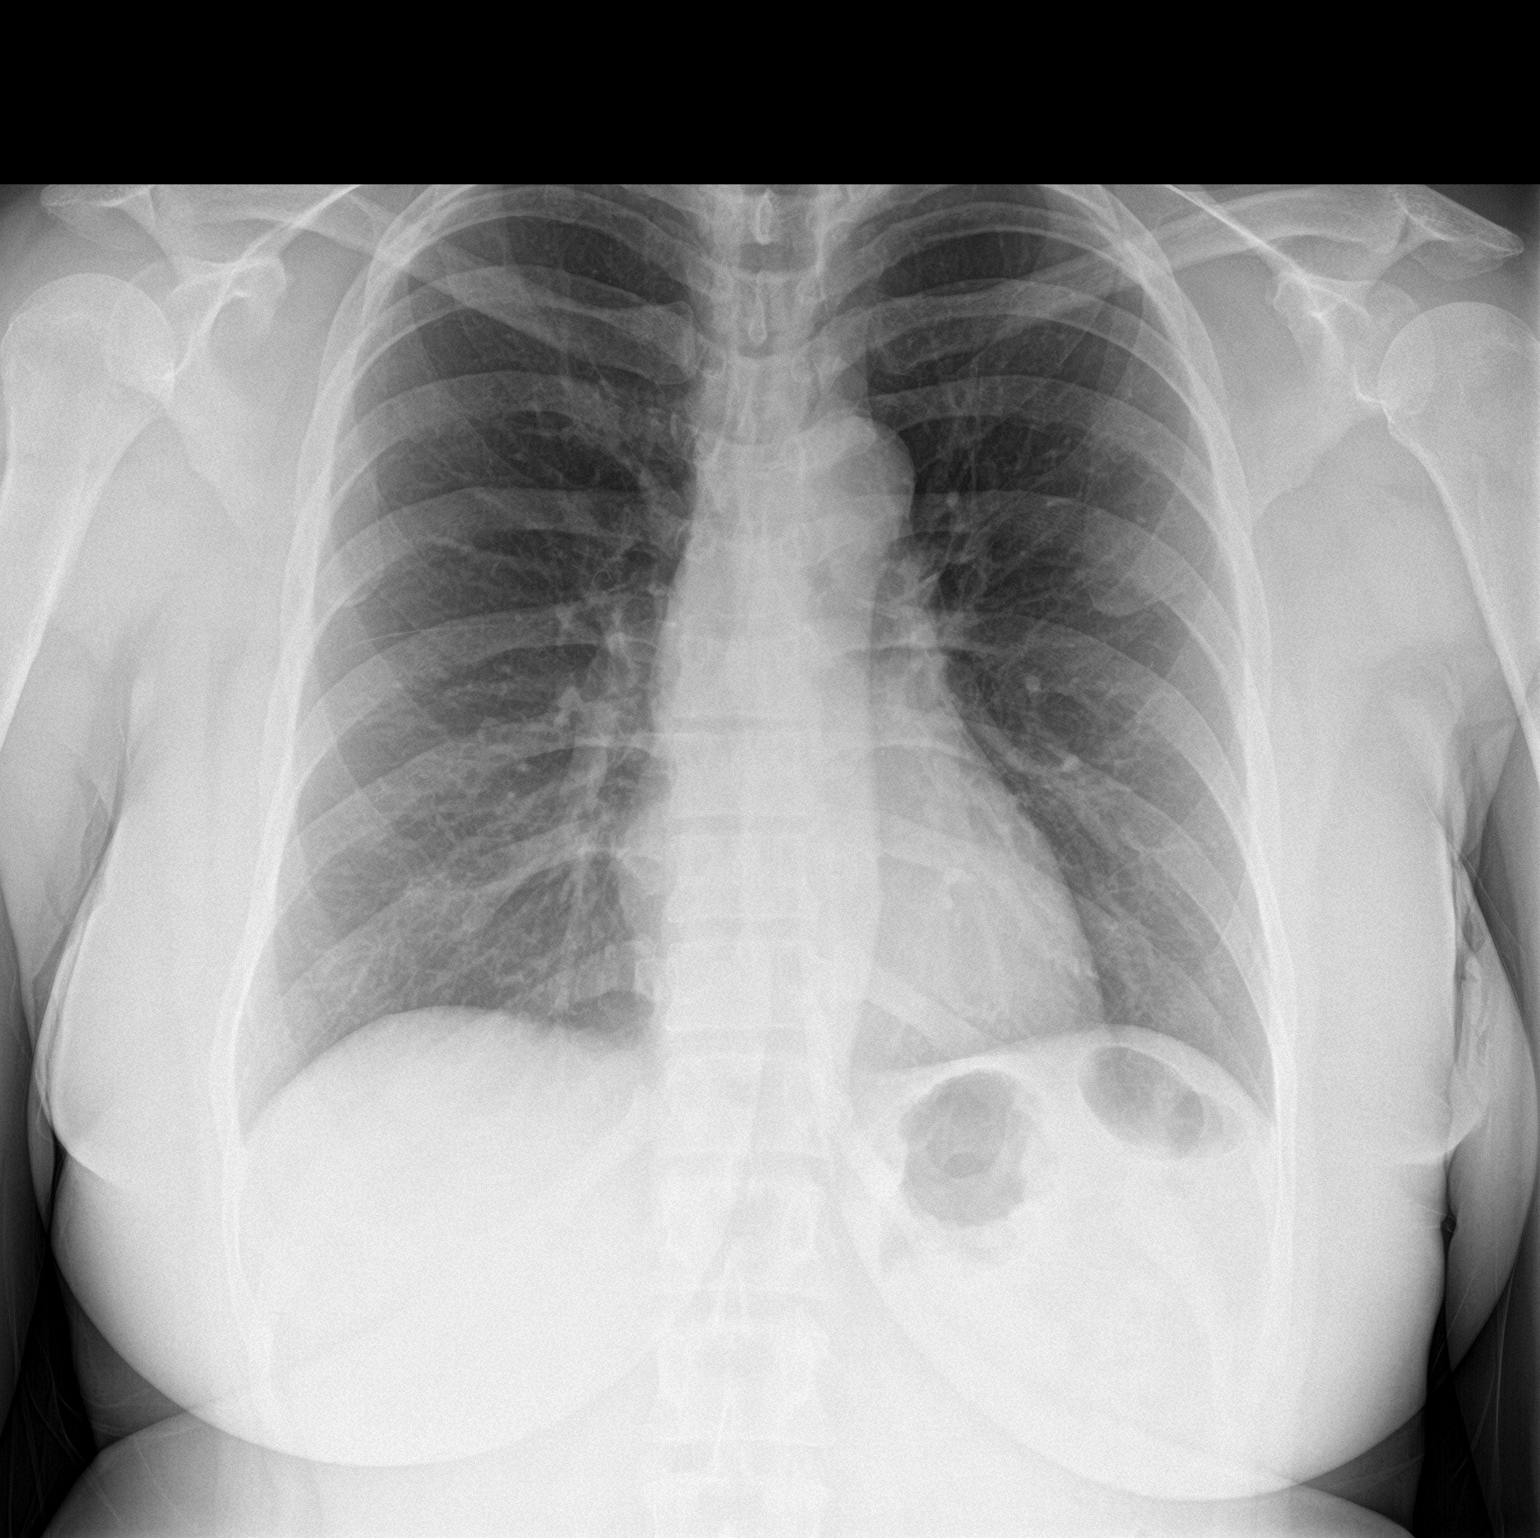

[chest lat]
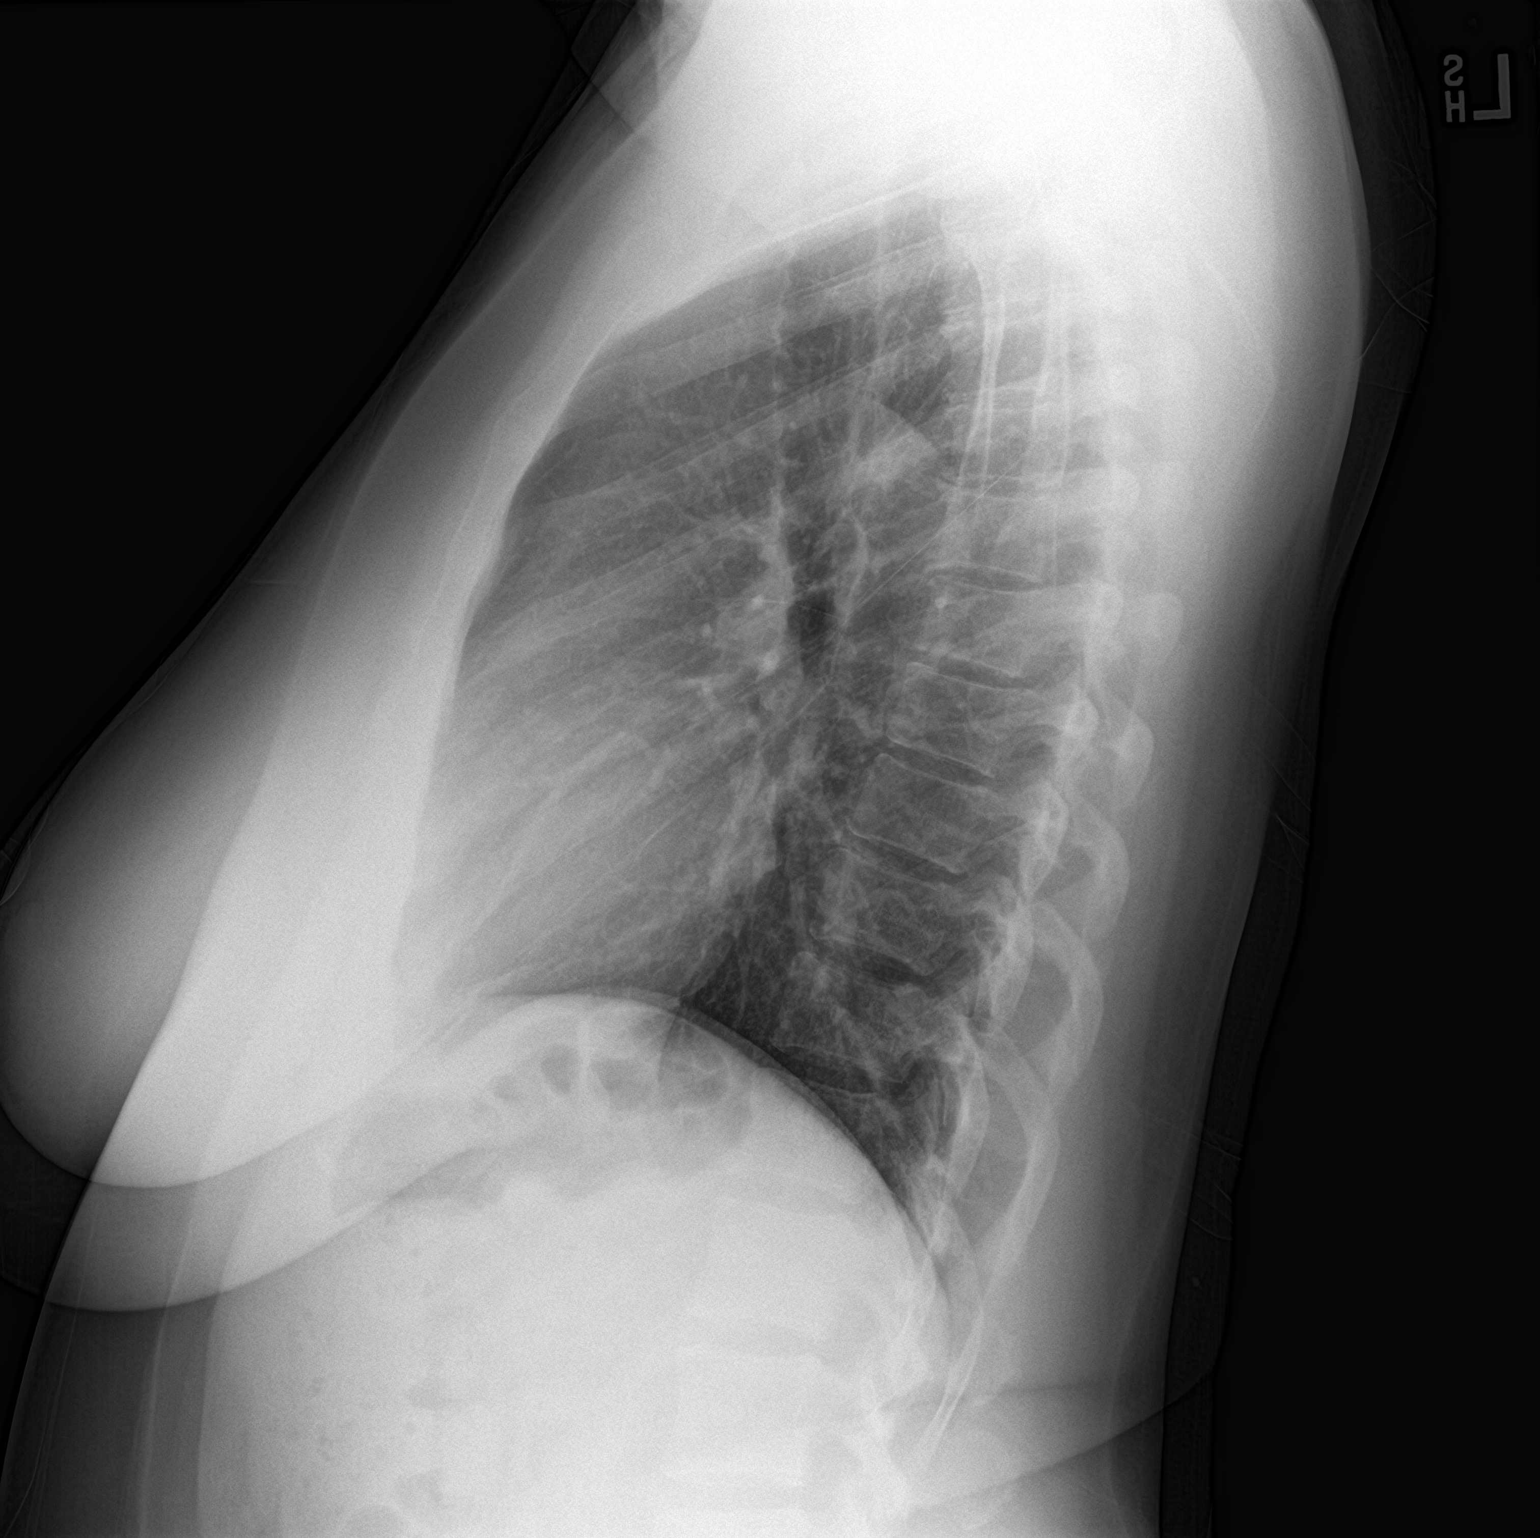

[2 of 2 positions shown; findings below may reference images not displayed]

FINDINGS: The heart size and mediastinal contours are within normal limits.
Both lungs are clear. The visualized skeletal structures are
unremarkable.
IMPRESSION: No active cardiopulmonary disease.

## 2021-12-23 ENCOUNTER — Telehealth: Payer: BC Managed Care – PPO | Admitting: Nurse Practitioner

## 2021-12-23 DIAGNOSIS — J0101 Acute recurrent maxillary sinusitis: Secondary | ICD-10-CM | POA: Diagnosis not present

## 2021-12-23 MED ORDER — PREDNISONE 20 MG PO TABS: 40.0000 mg | ORAL_TABLET | Freq: Every day | ORAL | 0 refills | Status: AC

## 2021-12-23 MED ORDER — AMOXICILLIN-POT CLAVULANATE 875-125 MG PO TABS: 1.0000 | ORAL_TABLET | Freq: Two times a day (BID) | ORAL | 0 refills | Status: DC

## 2021-12-23 NOTE — Progress Notes (Signed)
E-Visit for Sinus Problems  We are sorry that you are not feeling well.  Here is how we plan to help!  Based on what you have shared with me it looks like you have sinusitis.  Sinusitis is inflammation and infection in the sinus cavities of the head.  Based on your presentation I believe you most likely have Acute Bacterial Sinusitis.  This is an infection caused by bacteria and is treated with antibiotics. I have prescribed Aumentin 875mg  BId for 7 days and steroids 2 tablets at the same time daily for 5 days. You may use an oral decongestant such as Mucinex D or if you have glaucoma or high blood pressure use plain Mucinex. Saline nasal spray help and can safely be used as often as needed for congestion.  If you develop worsening sinus pain, fever or notice severe headache and vision changes, or if symptoms are not better after completion of antibiotic, please schedule an appointment with a health care provider.    Sinus infections are not as easily transmitted as other respiratory infection, however we still recommend that you avoid close contact with loved ones, especially the very young and elderly.  Remember to wash your hands thoroughly throughout the day as this is the number one way to prevent the spread of infection!  Home Care: Only take medications as instructed by your medical team. Complete the entire course of an antibiotic. Do not take these medications with alcohol. A steam or ultrasonic humidifier can help congestion.  You can place a towel over your head and breathe in the steam from hot water coming from a faucet. Avoid close contacts especially the very young and the elderly. Cover your mouth when you cough or sneeze. Always remember to wash your hands.  Get Help Right Away If: You develop worsening fever or sinus pain. You develop a severe head ache or visual changes. Your symptoms persist after you have completed your treatment plan.  Make sure you Understand these  instructions. Will watch your condition. Will get help right away if you are not doing well or get worse.  Thank you for choosing an e-visit.  Your e-visit answers were reviewed by a board certified advanced clinical practitioner to complete your personal care plan. Depending upon the condition, your plan could have included both over the counter or prescription medications.  Please review your pharmacy choice. Make sure the pharmacy is open so you can pick up prescription now. If there is a problem, you may contact your provider through and have the prescription routed to another pharmacy.  Your safety is important to [employer]. If you have drug allergies check your prescription carefully.   For the next 24 hours you can use MyChart to ask questions about today's visit, request a non-urgent call back, or ask for a work or school excuse. You will get an email in the next two days asking about your experience. I hope that your e-visit has been valuable and will speed your recovery.  5-10 minutes spent reviewing and documenting in chart.

## 2022-02-12 ENCOUNTER — Other Ambulatory Visit: Payer: Self-pay | Admitting: Physician Assistant

## 2022-02-12 DIAGNOSIS — E559 Vitamin D deficiency, unspecified: Secondary | ICD-10-CM

## 2022-03-13 ENCOUNTER — Other Ambulatory Visit: Payer: Self-pay | Admitting: Physician Assistant

## 2022-03-13 DIAGNOSIS — E559 Vitamin D deficiency, unspecified: Secondary | ICD-10-CM

## 2022-03-24 DIAGNOSIS — Z20822 Contact with and (suspected) exposure to covid-19: Secondary | ICD-10-CM | POA: Diagnosis not present

## 2022-03-24 DIAGNOSIS — U071 COVID-19: Secondary | ICD-10-CM | POA: Diagnosis not present

## 2022-03-31 DIAGNOSIS — G9339 Other post infection and related fatigue syndromes: Secondary | ICD-10-CM | POA: Diagnosis not present

## 2022-03-31 DIAGNOSIS — R0602 Shortness of breath: Secondary | ICD-10-CM | POA: Diagnosis not present

## 2022-03-31 DIAGNOSIS — R051 Acute cough: Secondary | ICD-10-CM | POA: Diagnosis not present

## 2022-03-31 DIAGNOSIS — U071 COVID-19: Secondary | ICD-10-CM | POA: Diagnosis not present

## 2022-04-23 ENCOUNTER — Other Ambulatory Visit: Payer: Self-pay | Admitting: Physician Assistant

## 2022-04-23 DIAGNOSIS — E559 Vitamin D deficiency, unspecified: Secondary | ICD-10-CM

## 2022-04-25 ENCOUNTER — Encounter: Payer: Self-pay | Admitting: Physician Assistant

## 2022-04-25 ENCOUNTER — Ambulatory Visit: Payer: BC Managed Care – PPO | Admitting: Physician Assistant

## 2022-04-25 VITALS — BP 148/93 | HR 86 | Temp 98.7°F | Ht 68.0 in | Wt 206.0 lb

## 2022-04-25 DIAGNOSIS — J208 Acute bronchitis due to other specified organisms: Secondary | ICD-10-CM

## 2022-04-25 DIAGNOSIS — R053 Chronic cough: Secondary | ICD-10-CM | POA: Diagnosis not present

## 2022-04-25 DIAGNOSIS — G9332 Myalgic encephalomyelitis/chronic fatigue syndrome: Secondary | ICD-10-CM

## 2022-04-25 DIAGNOSIS — R0602 Shortness of breath: Secondary | ICD-10-CM

## 2022-04-25 DIAGNOSIS — U099 Post covid-19 condition, unspecified: Secondary | ICD-10-CM

## 2022-04-25 DIAGNOSIS — B9689 Other specified bacterial agents as the cause of diseases classified elsewhere: Secondary | ICD-10-CM

## 2022-04-25 LAB — COMPLETE METABOLIC PANEL WITH GFR
AG Ratio: 1.6 (calc) (ref 1.0–2.5)
ALT: 26 U/L (ref 6–29)
AST: 23 U/L (ref 10–35)
Albumin: 4.6 g/dL (ref 3.6–5.1)
Alkaline phosphatase (APISO): 52 U/L (ref 37–153)
BUN: 12 mg/dL (ref 7–25)
CO2: 30 mmol/L (ref 20–32)
Calcium: 9.7 mg/dL (ref 8.6–10.4)
Chloride: 103 mmol/L (ref 98–110)
Creat: 1 mg/dL (ref 0.50–1.03)
Globulin: 2.9 g/dL (calc) (ref 1.9–3.7)
Glucose, Bld: 97 mg/dL (ref 65–99)
Potassium: 4 mmol/L (ref 3.5–5.3)
Sodium: 140 mmol/L (ref 135–146)
Total Bilirubin: 0.7 mg/dL (ref 0.2–1.2)
Total Protein: 7.5 g/dL (ref 6.1–8.1)
eGFR: 66 mL/min/{1.73_m2} (ref >=60)

## 2022-04-25 LAB — CBC WITH DIFFERENTIAL/PLATELET
Absolute Monocytes: 593 cells/uL (ref 200–950)
Basophils Absolute: 39 cells/uL (ref 0–200)
Basophils Relative: 0.5 %
Eosinophils Absolute: 169 cells/uL (ref 15–500)
Eosinophils Relative: 2.2 %
HCT: 37 % (ref 35.0–45.0)
Hemoglobin: 12.1 g/dL (ref 11.7–15.5)
Lymphs Abs: 3380 cells/uL (ref 850–3900)
MCH: 28 pg (ref 27.0–33.0)
MCHC: 32.7 g/dL (ref 32.0–36.0)
MCV: 85.6 fL (ref 80.0–100.0)
MPV: 12 fL (ref 7.5–12.5)
Monocytes Relative: 7.7 %
Neutro Abs: 3519 cells/uL (ref 1500–7800)
Neutrophils Relative %: 45.7 %
Platelets: 265 10*3/uL (ref 140–400)
RBC: 4.32 10*6/uL (ref 3.80–5.10)
RDW: 13.9 % (ref 11.0–15.0)
Total Lymphocyte: 43.9 %
WBC: 7.7 10*3/uL (ref 3.8–10.8)

## 2022-04-25 LAB — D-DIMER, QUANTITATIVE: D-Dimer, Quant: 0.3 mcg/mL FEU (ref <0.50)

## 2022-04-25 MED ORDER — AZITHROMYCIN 250 MG PO TABS: ORAL_TABLET | ORAL | 0 refills | Status: DC

## 2022-04-25 MED ORDER — IPRATROPIUM-ALBUTEROL 0.5-2.5 (3) MG/3ML IN SOLN
3.0000 mL | Freq: Once | RESPIRATORY_TRACT | Status: AC
  Administered 2022-04-25: 3 mL via RESPIRATORY_TRACT

## 2022-04-25 MED ORDER — PREDNISONE 20 MG PO TABS: ORAL_TABLET | ORAL | 0 refills | Status: DC

## 2022-04-25 NOTE — Progress Notes (Signed)
Established Patient Office Visit  Subjective   Patient ID: Bethany Murillo, female    DOB: 12-25-1965  Age: 56 y.o. MRN: [id number]  Chief Complaint  Patient presents with   Follow-up    Post Covid    HPI Pt is a 56 yo female with history of allergic rhinitis and chronic cough who presents to the clinic to follow up after covid infection in late august and persistent symptoms.   She had covid late august with typical URI symptoms. She went to UC on 9/2 and given paxlovid but did not take it. She rested and used symptomatic care. Her SOB and sweating worsened so she went to UC. She had CXR to rule out pneumonia. She was given 40mg  of prednisone to take for 5 days and albuterol nebulizer. She felt GREAT on prednisone but as soon as she stopped her SOB, fatigue, achiness came back. She uses albuterol but does not seem to help. Her pulse ox does not drop with movement even though she feels really short of breath. Her productive cough continues. She just feels terrible. No current fever, chills.   .. Active Ambulatory Problems    Diagnosis Date Noted   Vitamin D deficiency 01/14/2013   Cough 12/24/2013   Allergic rhinitis 12/24/2013   Eyelid edema 12/30/2013   Gallstones and inflammation of gallbladder without obstruction 06/29/2014   Class 1 obesity due to excess calories without serious comorbidity with body mass index (BMI) of 30.0 to 30.9 in adult 08/15/2015   Right knee pain 08/15/2015   Upper airway cough syndrome 05/23/2016   Overweight (BMI 25.0-29.9) 01/17/2018   Hot flashes, menopausal 08/25/2019   Persistent cough 02/17/2020   Ankle swelling 02/29/2020   Acute left-sided low back pain without sciatica 06/28/2020   Epigastric abdominal tenderness without rebound tenderness 05/25/2021   NSAID induced gastritis 05/25/2021   Constipation, chronic 05/25/2021   Generalized abdominal pain 09/12/2021   Resolved Ambulatory Problems    Diagnosis Date Noted   Asthmatic  bronchitis 11/13/2013   Past Medical History:  Diagnosis Date   Unspecified vitamin D deficiency 01/14/2013    ROS   See HPI.  Objective:     BP (!) 148/93   Pulse 86   Temp 98.7 F (37.1 C) (Oral)   Ht 5\' 8"  (1.727 m)   Wt 206 lb (93.4 kg)   LMP 05/12/2014 (Approximate)   SpO2 100%   PF 350 L/min Comment: green/ after duoneb  BMI 31.32 kg/m  BP Readings from Last 3 Encounters:  04/25/22 (!) 148/93  09/12/21 130/86  05/24/21 118/76   Wt Readings from Last 3 Encounters:  04/25/22 206 lb (93.4 kg)  09/12/21 203 lb (92.1 kg)  05/24/21 206 lb (93.4 kg)      Physical Exam Constitutional:      Appearance: Normal appearance.  HENT:     Head: Normocephalic.     Nose: Nose normal.     Mouth/Throat:     Mouth: Mucous membranes are moist.     Pharynx: No oropharyngeal exudate or posterior oropharyngeal erythema.  Eyes:     Conjunctiva/sclera: Conjunctivae normal.  Neck:     Vascular: No carotid bruit.  Cardiovascular:     Rate and Rhythm: Normal rate and regular rhythm.     Pulses: Normal pulses.     Heart sounds: Normal heart sounds.  Pulmonary:     Effort: Pulmonary effort is normal.     Breath sounds: Normal breath sounds.  Abdominal:  Palpations: Abdomen is soft.     Tenderness: There is no right CVA tenderness or left CVA tenderness.  Musculoskeletal:     Cervical back: Normal range of motion and neck supple. No tenderness.     Right lower leg: No edema.     Left lower leg: No edema.  Lymphadenopathy:     Cervical: No cervical adenopathy.  Neurological:     General: No focal deficit present.     Mental Status: She is alert and oriented to person, place, and time.  Psychiatric:        Mood and Affect: Mood normal.    PEAK FLOW at 250 in RED Duoneb given  PEAK FLOW at 350 in [place]:  Marland KitchenMarland KitchenAlohilani was seen today for follow-up.  Diagnoses and all orders for this visit:  SOB (shortness of breath) on exertion -      predniSONE (DELTASONE) 20 MG tablet; Take 3 tablets for 3 days, take 2 tablets for 3 days, take 1 tablet for 3 days, take 1/2 tablet for 4 days. -     D-Dimer, Quantitative -     CBC w/Diff/Platelet -     COMPLETE METABOLIC PANEL WITH GFR -     ipratropium-albuterol (DUONEB) 0.5-2.5 (3) MG/3ML nebulizer solution 3 mL  Post-COVID chronic fatigue -     predniSONE (DELTASONE) 20 MG tablet; Take 3 tablets for 3 days, take 2 tablets for 3 days, take 1 tablet for 3 days, take 1/2 tablet for 4 days. -     D-Dimer, Quantitative -     CBC w/Diff/Platelet -     COMPLETE METABOLIC PANEL WITH GFR  Post-COVID chronic cough -     predniSONE (DELTASONE) 20 MG tablet; Take 3 tablets for 3 days, take 2 tablets for 3 days, take 1 tablet for 3 days, take 1/2 tablet for 4 days. -     D-Dimer, Quantitative -     CBC w/Diff/Platelet -     COMPLETE METABOLIC PANEL WITH GFR -     ipratropium-albuterol (DUONEB) 0.5-2.5 (3) MG/3ML nebulizer solution 3 mL  Acute bacterial bronchitis -     azithromycin (ZITHROMAX Z-PAK) 250 MG tablet; Take 2 tablets (500 mg) on  Day 1,  followed by 1 tablet (250 mg) once daily on Days 2 through 5. -     predniSONE (DELTASONE) 20 MG tablet; Take 3 tablets for 3 days, take 2 tablets for 3 days, take 1 tablet for 3 days, take 1/2 tablet for 4 days. -     ipratropium-albuterol (DUONEB) 0.5-2.5 (3) MG/3ML nebulizer solution 3 mL   Start zpak and prednisone for post covid bacterial bronchitis Vitals are reassuring Pt did have a good response to nebulizer Keep using albuterol inhaler every 2-4 hours as needed for SOB/cough/chest tightness Start sample of trelegy once a day Will get labs to look at CBC and d-dimer If d-dimer elevated with get CTA If not improving consider echo for SOB Follow up in 2 weeks  Spent 45 minutes with patient reviewing chart, discussing intervention and plan, and managing care.    Iran Planas, PA-C

## 2022-04-25 NOTE — Progress Notes (Signed)
GREAT news. D-Dimer is low which means the likelyhood of any blood clot is really low. Continue with medications given today and then we will see how you improve at follow up.

## 2022-04-25 NOTE — Patient Instructions (Addendum)
Start zpak and prednisone Get labs Start trelegy 1 puff a day If d-dimer elevated get CTA If not improving after 5 days of medication will get echo

## 2022-04-25 NOTE — Progress Notes (Signed)
Kidney/liver/glucose look good.

## 2022-05-15 ENCOUNTER — Ambulatory Visit (INDEPENDENT_AMBULATORY_CARE_PROVIDER_SITE_OTHER): Payer: BC Managed Care – PPO | Admitting: Physician Assistant

## 2022-05-15 ENCOUNTER — Encounter: Payer: Self-pay | Admitting: Physician Assistant

## 2022-05-15 VITALS — BP 126/79 | HR 70 | Ht 68.0 in | Wt 211.0 lb

## 2022-05-15 DIAGNOSIS — E6609 Other obesity due to excess calories: Secondary | ICD-10-CM

## 2022-05-15 DIAGNOSIS — Z6832 Body mass index (BMI) 32.0-32.9, adult: Secondary | ICD-10-CM | POA: Diagnosis not present

## 2022-05-15 DIAGNOSIS — R0602 Shortness of breath: Secondary | ICD-10-CM

## 2022-05-15 MED ORDER — TOPIRAMATE 50 MG PO TABS: 50.0000 mg | ORAL_TABLET | Freq: Every day | ORAL | 1 refills | Status: DC

## 2022-05-15 NOTE — Progress Notes (Signed)
Established Patient Office Visit  Subjective   Patient ID: Bethany Murillo, female    DOB: 26-Mar-1966  Age: 56 y.o. MRN: [id number]  Chief Complaint  Patient presents with   Follow-up    HPI Pt is a 56 yo obese female who presents to the clinic to follow up on SOB on exertion after covid. She was treated with zpak, prednisone and sample of trelegy. She was feeling better in 5 days. She has her energy back. She is not coughing. She is feeling well.   She is frustrated with weight. Her husband is on topamax and she wants to try it. She has started eating better with more veggies. She is moving more often with walking. Phentermine helped off and on in the past.   . Active Ambulatory Problems    Diagnosis Date Noted   Vitamin D deficiency 01/14/2013   Cough 12/24/2013   Allergic rhinitis 12/24/2013   Eyelid edema 12/30/2013   Gallstones and inflammation of gallbladder without obstruction 06/29/2014   Class 1 obesity due to excess calories without serious comorbidity with body mass index (BMI) of 30.0 to 30.9 in adult 08/15/2015   Right knee pain 08/15/2015   Upper airway cough syndrome 05/23/2016   Overweight (BMI 25.0-29.9) 01/17/2018   Hot flashes, menopausal 08/25/2019   Persistent cough 02/17/2020   Ankle swelling 02/29/2020   Acute left-sided low back pain without sciatica 06/28/2020   Epigastric abdominal tenderness without rebound tenderness 05/25/2021   NSAID induced gastritis 05/25/2021   Constipation, chronic 05/25/2021   Generalized abdominal pain 09/12/2021   SOB (shortness of breath) 05/15/2022   Resolved Ambulatory Problems    Diagnosis Date Noted   Asthmatic bronchitis 11/13/2013   Past Medical History:  Diagnosis Date   Unspecified vitamin D deficiency 01/14/2013     ROS See HPI.    Objective:     BP 126/79   Pulse 70   Ht 5\' 8"  (1.727 m)   Wt 211 lb (95.7 kg)   LMP 05/12/2014 (Approximate)   SpO2 100%   BMI 32.08 kg/m  BP Readings from Last  3 Encounters:  05/15/22 126/79  04/25/22 (!) 148/93  09/12/21 130/86   Wt Readings from Last 3 Encounters:  05/15/22 211 lb (95.7 kg)  04/25/22 206 lb (93.4 kg)  09/12/21 203 lb (92.1 kg)      Physical Exam Constitutional:      Appearance: Normal appearance. She is obese.  HENT:     Head: Normocephalic.     Mouth/Throat:     Mouth: Mucous membranes are moist.  Cardiovascular:     Rate and Rhythm: Normal rate and regular rhythm.     Pulses: Normal pulses.     Heart sounds: Normal heart sounds.  Pulmonary:     Effort: Pulmonary effort is normal.     Breath sounds: Normal breath sounds.  Musculoskeletal:     Right lower leg: No edema.     Left lower leg: No edema.  Neurological:     General: No focal deficit present.     Mental Status: She is alert and oriented to person, place, and time.  Psychiatric:        Mood and Affect: Mood normal.          Assessment & Plan:  Marland KitchenMarland KitchenNecia was seen today for follow-up.  Diagnoses and all orders for this visit:  SOB (shortness of breath) on exertion  Class 1 obesity due to excess calories without serious comorbidity with body mass  index (BMI) of 32.0 to 32.9 in adult -     topiramate (TOPAMAX) 50 MG tablet; Take 1 tablet (50 mg total) by mouth at bedtime.   Lungs sound great and patient feeling great  .Marland KitchenDiscussed low carb diet with 1500 calories and 80g of protein.  Exercising at least 150 minutes a week.  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  Trial of topamax discussed side effects Continue working on diet and exercise Follow up in 3 months   Tandy Gaw, PA-C

## 2022-07-01 ENCOUNTER — Other Ambulatory Visit: Payer: Self-pay | Admitting: Physician Assistant

## 2022-07-01 DIAGNOSIS — E559 Vitamin D deficiency, unspecified: Secondary | ICD-10-CM

## 2022-07-01 DIAGNOSIS — J310 Chronic rhinitis: Secondary | ICD-10-CM

## 2022-07-22 ENCOUNTER — Other Ambulatory Visit: Payer: Self-pay | Admitting: Physician Assistant

## 2022-07-22 DIAGNOSIS — E559 Vitamin D deficiency, unspecified: Secondary | ICD-10-CM

## 2022-09-12 ENCOUNTER — Ambulatory Visit: Payer: BC Managed Care – PPO | Admitting: Physician Assistant

## 2022-09-12 ENCOUNTER — Other Ambulatory Visit: Payer: Self-pay | Admitting: Physician Assistant

## 2022-09-12 ENCOUNTER — Encounter: Payer: Self-pay | Admitting: Physician Assistant

## 2022-09-12 ENCOUNTER — Telehealth: Payer: Self-pay

## 2022-09-12 VITALS — BP 134/92 | HR 82 | Ht 68.0 in | Wt 213.0 lb

## 2022-09-12 DIAGNOSIS — J40 Bronchitis, not specified as acute or chronic: Secondary | ICD-10-CM | POA: Diagnosis not present

## 2022-09-12 DIAGNOSIS — J329 Chronic sinusitis, unspecified: Secondary | ICD-10-CM | POA: Diagnosis not present

## 2022-09-12 MED ORDER — HYDROCOD POLI-CHLORPHE POLI ER 10-8 MG/5ML PO SUER: 5.0000 mL | Freq: Two times a day (BID) | ORAL | 0 refills | Status: DC | PRN

## 2022-09-12 MED ORDER — AIRSUPRA 90-80 MCG/ACT IN AERO: 2.0000 | INHALATION_SPRAY | Freq: Four times a day (QID) | RESPIRATORY_TRACT | 1 refills | Status: DC | PRN

## 2022-09-12 MED ORDER — DOXYCYCLINE HYCLATE 100 MG PO TABS: 100.0000 mg | ORAL_TABLET | Freq: Two times a day (BID) | ORAL | 0 refills | Status: DC

## 2022-09-12 MED ORDER — PREDNISONE 20 MG PO TABS: ORAL_TABLET | ORAL | 0 refills | Status: DC

## 2022-09-12 NOTE — Progress Notes (Signed)
Acute Office Visit  Subjective:     Patient ID: Bethany Murillo, female    DOB: [birth year], 57 y.o.   MRN: [id number]  Chief Complaint  Patient presents with   Cough    Pain in throat   Chest Pain    HPI Patient is in today for productive cough that is worsening. She is coughing so bad she is hurting underneath her breast. She has never smoked. Denies any asthma or COPD. She does have chronic allergies and history of bronchitis. Her cough is barking with production. She cannot sleep at night. She had some trelegy left from sample given and got great benefit temporarily.   .. Active Ambulatory Problems    Diagnosis Date Noted   Vitamin D deficiency 01/14/2013   Cough 12/24/2013   Allergic rhinitis 12/24/2013   Eyelid edema 12/30/2013   Gallstones and inflammation of gallbladder without obstruction 06/29/2014   Class 1 obesity due to excess calories without serious comorbidity with body mass index (BMI) of 30.0 to 30.9 in adult 08/15/2015   Right knee pain 08/15/2015   Upper airway cough syndrome 05/23/2016   Overweight (BMI 25.0-29.9) 01/17/2018   Hot flashes, menopausal 08/25/2019   Persistent cough 02/17/2020   Ankle swelling 02/29/2020   Acute left-sided low back pain without sciatica 06/28/2020   Epigastric abdominal tenderness without rebound tenderness 05/25/2021   NSAID induced gastritis 05/25/2021   Constipation, chronic 05/25/2021   Generalized abdominal pain 09/12/2021   SOB (shortness of breath) 05/15/2022   Resolved Ambulatory Problems    Diagnosis Date Noted   Asthmatic bronchitis 11/13/2013   Past Medical History:  Diagnosis Date   Unspecified vitamin D deficiency 01/14/2013      ROS  See HPI.     Objective:    BP (!) 134/92 (BP Location: Left Arm, Patient Position: Sitting, Cuff Size: Large)   Pulse 82   Ht 5' 8"$  (1.727 m)   Wt 213 lb (96.6 kg)   LMP 05/12/2014 (Approximate)   SpO2 96%   BMI 32.39 kg/m  BP Readings from Last 3  Encounters:  09/12/22 (!) 134/92  05/15/22 126/79  04/25/22 (!) 148/93   Wt Readings from Last 3 Encounters:  09/12/22 213 lb (96.6 kg)  05/15/22 211 lb (95.7 kg)  04/25/22 206 lb (93.4 kg)      Physical Exam Constitutional:      Appearance: She is well-developed.  HENT:     Head: Normocephalic.  Eyes:     Pupils: Pupils are equal, round, and reactive to light.  Cardiovascular:     Rate and Rhythm: Normal rate and regular rhythm.  Pulmonary:     Breath sounds: Decreased breath sounds and rhonchi present. No wheezing or rales.     Comments: Productive barking cough, no wheezing Musculoskeletal:     Cervical back: Normal range of motion.     Right lower leg: No tenderness.     Left lower leg: No tenderness.  Lymphadenopathy:     Cervical: Cervical adenopathy present.  Neurological:     General: No focal deficit present.     Mental Status: She is alert.  Psychiatric:        Mood and Affect: Mood normal.    Pt found it hard to talk without coughing.  Duoneb given in office with benefit       Assessment & Plan:  Bethany KitchenMarland KitchenMaecy was seen today for cough and chest pain.  Diagnoses and all orders for this visit:  Sinobronchitis -  predniSONE (DELTASONE) 20 MG tablet; Take 3 tablets for 3 days, take 2 tablets for 3 days, take 1 tablet for 3 days, take 1/2 tablet for 4 days. -     doxycycline (VIBRA-TABS) 100 MG tablet; Take 1 tablet (100 mg total) by mouth 2 (two) times daily. -     Albuterol-Budesonide (AIRSUPRA) 90-80 MCG/ACT AERO; Inhale 2 puffs into the lungs every 6 (six) hours as needed. -     chlorpheniramine-HYDROcodone (TUSSIONEX) 10-8 MG/5ML; Take 5 mLs by mouth every 12 (twelve) hours as needed for cough (cough, will cause drowsiness.).   Last bronchitis October 2023.  Vitals stable.  Duoneb given in office today with benefit, pt could talk without coughing after treatment Sent doxycycline and prednisone Airsupra for rescue Tussionex for cough at  bedtime Follow up as needed or if symptoms worsen or persist.    Bethany Planas, PA-C

## 2022-09-12 NOTE — Telephone Encounter (Signed)
PA

## 2022-09-12 NOTE — Telephone Encounter (Signed)
PA SUBMITTED PATIENT HAS COPAY CARD

## 2022-09-12 NOTE — Patient Instructions (Addendum)
Zyrtec D at onset of any post nasal drip  Prednisone/doxycycline start

## 2022-09-12 NOTE — Telephone Encounter (Signed)
Initiated Prior authorization [id number] (VENTOLIN HFA) 108 (90 Base) MCG/ACT inhaler Via: Covermymeds Case/Key:[id number] Status: Pending as of 09/12/22 Reason: Notified Pt via: Mychart

## 2022-09-14 ENCOUNTER — Encounter: Payer: Self-pay | Admitting: Physician Assistant

## 2022-09-19 ENCOUNTER — Telehealth: Payer: BC Managed Care – PPO | Admitting: Physician Assistant

## 2022-09-22 ENCOUNTER — Other Ambulatory Visit: Payer: Self-pay | Admitting: Physician Assistant

## 2022-09-22 DIAGNOSIS — J310 Chronic rhinitis: Secondary | ICD-10-CM

## 2022-10-29 ENCOUNTER — Encounter: Payer: Self-pay | Admitting: Physician Assistant

## 2022-10-30 ENCOUNTER — Telehealth: Payer: Self-pay | Admitting: Family Medicine

## 2022-10-30 ENCOUNTER — Ambulatory Visit: Payer: BC Managed Care – PPO | Admitting: Family Medicine

## 2022-10-30 VITALS — BP 127/84 | HR 90 | Temp 99.1°F | Resp 20 | Ht 68.0 in | Wt 211.7 lb

## 2022-10-30 DIAGNOSIS — J40 Bronchitis, not specified as acute or chronic: Secondary | ICD-10-CM

## 2022-10-30 DIAGNOSIS — U099 Post covid-19 condition, unspecified: Secondary | ICD-10-CM | POA: Diagnosis not present

## 2022-10-30 DIAGNOSIS — J329 Chronic sinusitis, unspecified: Secondary | ICD-10-CM | POA: Diagnosis not present

## 2022-10-30 DIAGNOSIS — E559 Vitamin D deficiency, unspecified: Secondary | ICD-10-CM

## 2022-10-30 DIAGNOSIS — R053 Chronic cough: Secondary | ICD-10-CM

## 2022-10-30 LAB — POCT INFLUENZA A/B
Influenza A, POC: NEGATIVE
Influenza B, POC: NEGATIVE

## 2022-10-30 MED ORDER — PREDNISONE 20 MG PO TABS: 40.0000 mg | ORAL_TABLET | Freq: Every day | ORAL | 0 refills | Status: DC

## 2022-10-30 MED ORDER — AZITHROMYCIN 250 MG PO TABS
ORAL_TABLET | ORAL | 0 refills | Status: AC
Start: 2022-10-30 — End: 2022-11-04

## 2022-10-30 MED ORDER — ALBUTEROL SULFATE (2.5 MG/3ML) 0.083% IN NEBU
2.5000 mg | INHALATION_SOLUTION | Freq: Four times a day (QID) | RESPIRATORY_TRACT | 1 refills | Status: AC | PRN
Start: 2022-10-30 — End: ?

## 2022-10-30 NOTE — Telephone Encounter (Signed)
Task completed. Patient has been informed of provider's recommendation and is agreeable with current plan. Patient will stop by to have her vitamin D levels checked. During the call patient asked about her referral for Pulmonologist. She was informed of referral process and is aware to contact the clinic if she doesn't hear back within a week or so for scheduling with the specialist. No other inquires asked during the call.

## 2022-10-30 NOTE — Telephone Encounter (Signed)
Call Patient: She had requested vitamin D refills but we have not actually checked her vitamin D since 2022.  I really like for her to come by the lab sometime and have that drawn before we continue to refill the medication.  If she is an adequate goal then she can also start the over-the-counter vitamin D 25 mcg daily supplement.

## 2022-10-30 NOTE — Telephone Encounter (Signed)
Left message for a return call

## 2022-10-30 NOTE — Progress Notes (Signed)
Acute Office Visit  Subjective:     Patient ID: Bethany Murillo, female    DOB: 1966-07-18, 57 y.o.   MRN: [id number]  Chief Complaint  Patient presents with   Cough    Patient states that she has had a bad cough & wheezing  since last week on Friday , She has also lost her voice. She has been coughing up some yellowish thich mucus    HPI Patient is in today for cough, wheezing x 1 week. Using tylenol, benadryl.  + yellow mucous.  .+ laryngitis.  He was prescribed an albuterol inhaler in January but the insurance would not allow her to fill it. + fatigue.  Travels a lot for work.  She has not done a COVID test.  He said she had COVID about 4 to 5 months ago.  This feels very different.  She does not have a neb machine but just needs to the nebulizer treatments to go in it.  She also reports a more chronic cough and hx fo recurrent bronchitis.  She was followed by pulmonology at one point and would like to get back in with them again.  ROS      Objective:    BP 127/84   Pulse 90   Temp 99.1 F (37.3 C) (Oral)   Resp 20   Ht 5\' 8"  (1.727 m)   Wt 211 lb 11.2 oz (96 kg)   LMP 05/12/2014 (Approximate)   SpO2 97%   BMI 32.19 kg/m    Physical Exam Constitutional:      Appearance: She is well-developed.  HENT:     Head: Normocephalic and atraumatic.     Right Ear: Tympanic membrane, ear canal and external ear normal.     Left Ear: Tympanic membrane, ear canal and external ear normal.     Nose: Nose normal.  Eyes:     Conjunctiva/sclera: Conjunctivae normal.     Pupils: Pupils are equal, round, and reactive to light.  Neck:     Thyroid: No thyromegaly.  Cardiovascular:     Rate and Rhythm: Normal rate and regular rhythm.     Heart sounds: Normal heart sounds.  Pulmonary:     Effort: Pulmonary effort is normal.     Breath sounds: Normal breath sounds. No wheezing.  Musculoskeletal:     Cervical back: Neck supple.  Lymphadenopathy:     Cervical: No cervical  adenopathy.  Skin:    General: Skin is warm and dry.  Neurological:     Mental Status: She is alert and oriented to person, place, and time.    Results for orders placed or performed in visit on 10/30/22  POCT Influenza A/B  Result Value Ref Range   Influenza A, POC Negative Negative   Influenza B, POC Negative Negative        Assessment & Plan:   Problem List Items Addressed This Visit       Other   Vitamin D deficiency   Relevant Orders   VITAMIN D 25 Hydroxy (Vit-D Deficiency, Fractures)   Other Visit Diagnoses     Post-COVID chronic cough    -  Primary   Relevant Orders   POCT Influenza A/B (Completed)   Ambulatory referral to Pulmonology   Sinobronchitis       Relevant Medications   predniSONE (DELTASONE) 20 MG tablet   albuterol (PROVENTIL) (2.5 MG/3ML) 0.083% nebulizer solution   azithromycin (ZITHROMAX) 250 MG tablet      Chronic cough and  respiratory issues she would like to be referred back to pulmonology.  Will go ahead and place new referral.  More acute sinobronchitis-will treat with prednisone and azithromycin.  Call if not better in 1 week.  If she feels like she is getting worse we can always get a chest x-ray if needed.  History of vitamin D deficiency and is on prescription supplementation but we have not rechecked her vitamin D level in at least a year and a half.  She will need to go for updated blood work.  Meds ordered this encounter  Medications   predniSONE (DELTASONE) 20 MG tablet    Sig: Take 2 tablets (40 mg total) by mouth daily with breakfast.    Dispense:  10 tablet    Refill:  0   albuterol (PROVENTIL) (2.5 MG/3ML) 0.083% nebulizer solution    Sig: Take 3 mLs (2.5 mg total) by nebulization every 6 (six) hours as needed for wheezing or shortness of breath.    Dispense:  60 mL    Refill:  1   azithromycin (ZITHROMAX) 250 MG tablet    Sig: 2 Ttabs PO on Day 1, then one a day x 4 days.    Dispense:  6 tablet    Refill:  0     Return if symptoms worsen or fail to improve.  Beatrice Lecher, MD

## 2022-11-05 ENCOUNTER — Other Ambulatory Visit: Payer: Self-pay | Admitting: Physician Assistant

## 2022-11-05 DIAGNOSIS — J310 Chronic rhinitis: Secondary | ICD-10-CM

## 2022-11-05 MED ORDER — MONTELUKAST SODIUM 10 MG PO TABS
10.0000 mg | ORAL_TABLET | Freq: Every day | ORAL | 3 refills | Status: DC
Start: 2022-11-05 — End: 2022-11-05

## 2022-11-05 MED ORDER — MONTELUKAST SODIUM 10 MG PO TABS
10.0000 mg | ORAL_TABLET | Freq: Every day | ORAL | 3 refills | Status: DC
Start: 2022-11-05 — End: 2022-11-28

## 2022-11-05 NOTE — Telephone Encounter (Signed)
Requesting rx rf of montelukast Last written 07/02/2022 Last appt 10/30/22. Paitent given other medications on this day.sent for your review.

## 2022-11-26 ENCOUNTER — Telehealth: Payer: Self-pay | Admitting: Physician Assistant

## 2022-11-26 NOTE — Telephone Encounter (Signed)
Pt called stating she is need of refill on Montelukast. Pharmacy told pt that she needs prior authorization.

## 2022-11-27 ENCOUNTER — Encounter: Payer: Self-pay | Admitting: Physician Assistant

## 2022-11-27 ENCOUNTER — Other Ambulatory Visit: Payer: Self-pay | Admitting: Physician Assistant

## 2022-11-27 DIAGNOSIS — J310 Chronic rhinitis: Secondary | ICD-10-CM

## 2022-11-28 MED ORDER — MONTELUKAST SODIUM 10 MG PO TABS
10.0000 mg | ORAL_TABLET | Freq: Every day | ORAL | 3 refills | Status: DC
Start: 2022-11-28 — End: 2022-11-28

## 2022-11-28 MED ORDER — MONTELUKAST SODIUM 10 MG PO TABS
10.0000 mg | ORAL_TABLET | Freq: Every day | ORAL | 3 refills | Status: AC
Start: 2022-11-28 — End: ?

## 2022-11-30 NOTE — Telephone Encounter (Signed)
Patient needed a refill

## 2023-02-22 ENCOUNTER — Encounter: Payer: Self-pay | Admitting: Physician Assistant

## 2023-02-22 ENCOUNTER — Ambulatory Visit (INDEPENDENT_AMBULATORY_CARE_PROVIDER_SITE_OTHER): Payer: BC Managed Care – PPO | Admitting: Physician Assistant

## 2023-02-22 VITALS — BP 125/86 | HR 88 | Ht 68.0 in | Wt 210.0 lb

## 2023-02-22 DIAGNOSIS — Z131 Encounter for screening for diabetes mellitus: Secondary | ICD-10-CM

## 2023-02-22 DIAGNOSIS — E559 Vitamin D deficiency, unspecified: Secondary | ICD-10-CM

## 2023-02-22 DIAGNOSIS — Z1231 Encounter for screening mammogram for malignant neoplasm of breast: Secondary | ICD-10-CM | POA: Diagnosis not present

## 2023-02-22 DIAGNOSIS — E6609 Other obesity due to excess calories: Secondary | ICD-10-CM

## 2023-02-22 DIAGNOSIS — Z13220 Encounter for screening for lipoid disorders: Secondary | ICD-10-CM

## 2023-02-22 DIAGNOSIS — Z6831 Body mass index (BMI) 31.0-31.9, adult: Secondary | ICD-10-CM

## 2023-02-22 MED ORDER — PHENTERMINE HCL 37.5 MG PO TABS: 37.5000 mg | ORAL_TABLET | Freq: Every day | ORAL | 0 refills | Status: DC

## 2023-02-22 NOTE — Patient Instructions (Signed)
Obesity, Adult Obesity is having too much body fat. Being obese means that your weight is more than what is healthy for you.  BMI (body mass index) is a number that explains how much body fat you have. If you have a BMI of 30 or more, you are obese. Obesity can cause serious health problems, such as: Stroke. Coronary artery disease (CAD). Type 2 diabetes. Some types of cancer. High blood pressure (hypertension). High cholesterol. Gallbladder stones. Obesity can also contribute to: Osteoarthritis. Sleep apnea. Infertility problems. What are the causes? Eating meals each day that are high in calories, sugar, and fat. Drinking a lot of drinks that have sugar in them. Being born with genes that may make you more likely to become obese. Having a medical condition that causes obesity. Taking certain medicines. Sitting a lot (having a sedentary lifestyle). Not getting enough sleep. What increases the risk? Having a family history of obesity. Living in an area with limited access to: Parks, recreation centers, or sidewalks. Healthy food choices, such as grocery stores and farmers' markets. What are the signs or symptoms? The main sign is having too much body fat. How is this treated? Treatment for this condition often includes changing your lifestyle. Treatment may include: Changing your diet. This may include making a healthy meal plan. Exercise. This may include activity that causes your heart to beat faster (aerobic exercise) and strength training. Work with your doctor to design a program that works for you. Medicine to help you lose weight. This may be used if you are not able to lose one pound a week after 6 weeks of healthy eating and more exercise. Treating conditions that cause the obesity. Surgery. Options may include gastric banding and gastric bypass. This may be done if: Other treatments have not helped to improve your condition. You have a BMI of 40 or higher. You have  life-threatening health problems related to obesity. Follow these instructions at home: Eating and drinking  Follow advice from your doctor about what to eat and drink. Your doctor may tell you to: Limit fast food, sweets, and processed snack foods. Choose low-fat options. For example, choose low-fat milk instead of whole milk. Eat five or more servings of fruits or vegetables each day. Eat at home more often. This gives you more control over what you eat. Choose healthy foods when you eat out. Learn to read food labels. This will help you learn how much food is in one serving. Keep low-fat snacks available. Avoid drinks that have a lot of sugar in them. These include soda, fruit juice, iced tea with sugar, and flavored milk. Drink enough water to keep your pee (urine) pale yellow. Do not go on fad diets. Physical activity Exercise often, as told by your doctor. Most adults should get up to 150 minutes of moderate-intensity exercise every week.Ask your doctor: What types of exercise are safe for you. How often you should exercise. Warm up and stretch before being active. Do slow stretching after being active (cool down). Rest between times of being active. Lifestyle Work with your doctor and a food expert (dietitian) to set a weight-loss goal that is best for you. Limit your screen time. Find ways to reward yourself that do not involve food. Do not drink alcohol if: Your doctor tells you not to drink. You are pregnant, may be pregnant, or are planning to become pregnant. If you drink alcohol: Limit how much you have to: 0-1 drink a day for women. 0-2 drinks   a day for men. Know how much alcohol is in your drink. In the [place] one drink equals one 12 oz bottle of beer (355 mL), one 5 oz glass of wine (148 mL), or one 1 oz glass of hard liquor (44 mL). General instructions Keep a weight-loss journal. This can help you keep track of: The food that you eat. How much exercise you  get. Take over-the-counter and prescription medicines only as told by your doctor. Take vitamins and supplements only as told by your doctor. Think about joining a support group. Pay attention to your mental health as obesity can lead to depression or self esteem issues. Keep all follow-up visits. Contact a doctor if: You cannot meet your weight-loss goal after you have changed your diet and lifestyle for 6 weeks. You are having trouble breathing. Summary Obesity is having too much body fat. Being obese means that your weight is more than what is healthy for you. Work with your doctor to set a weight-loss goal. Get regular exercise as told by your doctor. This information is not intended to replace advice given to you by your health care provider. Make sure you discuss any questions you have with your health care provider. Document Revised: 02/21/2021 Document Reviewed: 02/21/2021 Elsevier Patient Education  2024 [employer] Inc[employer]  

## 2023-02-22 NOTE — Progress Notes (Signed)
Established Patient Office Visit  Subjective   Patient ID: Bethany Murillo, female    DOB: 1965/10/24  Age: 57 y.o. MRN: [id number]  Chief Complaint  Patient presents with   Follow-up    Pt would like to restart Phentermine      HPI Pt is a 57 yo obese female who presents to the clinic to discuss weight. Her insurance will not pay for weight loss medication. Topamax did not help. She did well with phentermine in the past and would like to restart it. She is trying to eat healthy and exercise. Right now she does not have a lot of motivation.   .. Active Ambulatory Problems    Diagnosis Date Noted   Vitamin D deficiency 01/14/2013   Cough 12/24/2013   Allergic rhinitis 12/24/2013   Eyelid edema 12/30/2013   Gallstones and inflammation of gallbladder without obstruction 06/29/2014   Class 1 obesity due to excess calories without serious comorbidity with body mass index (BMI) of 31.0 to 31.9 in adult 08/15/2015   Right knee pain 08/15/2015   Upper airway cough syndrome 05/23/2016   Overweight (BMI 25.0-29.9) 01/17/2018   Hot flashes, menopausal 08/25/2019   Persistent cough 02/17/2020   Ankle swelling 02/29/2020   Acute left-sided low back pain without sciatica 06/28/2020   NSAID induced gastritis 05/25/2021   Constipation, chronic 05/25/2021   Generalized abdominal pain 09/12/2021   SOB (shortness of breath) 05/15/2022   Resolved Ambulatory Problems    Diagnosis Date Noted   Asthmatic bronchitis 11/13/2013   Epigastric abdominal tenderness without rebound tenderness 05/25/2021   Past Medical History:  Diagnosis Date   Unspecified vitamin D deficiency 01/14/2013     ROS   See HPI.  Objective:     BP 125/86   Pulse 88   Ht 5\' 8"  (1.727 m)   Wt 210 lb (95.3 kg)   LMP 05/12/2014 (Approximate)   SpO2 100%   BMI 31.93 kg/m  BP Readings from Last 3 Encounters:  02/22/23 125/86  10/30/22 127/84  09/12/22 (!) 134/92   Wt Readings from Last 3 Encounters:   02/22/23 210 lb (95.3 kg)  10/30/22 211 lb 11.2 oz (96 kg)  09/12/22 213 lb (96.6 kg)   ..    10/30/2022   12:14 PM 09/12/2022   10:55 AM 04/25/2022    7:35 AM 02/17/2020    8:29 AM 08/24/2019    3:45 PM  Depression screen PHQ 2/9  Decreased Interest 0 0 0 0 0  Down, Depressed, Hopeless 0 0 1 0 0  PHQ - 2 Score 0 0 1 0 0  Altered sleeping    3 1  Tired, decreased energy    1 0  Change in appetite    3 1  Feeling bad or failure about yourself     0 0  Trouble concentrating    0 0  Moving slowly or fidgety/restless    0 0  Suicidal thoughts    0 0  PHQ-9 Score    7 2  Difficult doing work/chores    Somewhat difficult Not difficult at all      Physical Exam Constitutional:      Appearance: Normal appearance. She is obese.  HENT:     Head: Normocephalic.  Cardiovascular:     Rate and Rhythm: Normal rate and regular rhythm.  Pulmonary:     Effort: Pulmonary effort is normal.     Breath sounds: Normal breath sounds.  Neurological:  General: No focal deficit present.     Mental Status: She is alert and oriented to person, place, and time.  Psychiatric:        Mood and Affect: Mood normal.      The 10-year ASCVD risk score (Arnett DK, et al., 2019) is: 3.3%    Assessment & Plan:  Marland KitchenMarland KitchenDarlane was seen today for follow-up.  Diagnoses and all orders for this visit:  Class 1 obesity due to excess calories without serious comorbidity with body mass index (BMI) of 31.0 to 31.9 in adult -     CMP14+EGFR -     TSH -     CBC w/Diff/Platelet -     phentermine (ADIPEX-P) 37.5 MG tablet; Take 1 tablet (37.5 mg total) by mouth daily before breakfast.  Screening mammogram for breast cancer -     MM 3D SCREENING MAMMOGRAM BILATERAL BREAST  Vitamin D deficiency -     Vitamin D (25 hydroxy)  Screening for diabetes mellitus -     CMP14+EGFR  Screening for lipid disorders -     Lipid panel  Other orders -     Discontinue: phentermine (ADIPEX-P) 37.5 MG tablet; Take 1 tablet  (37.5 mg total) by mouth daily before breakfast.   ..Discussed low carb diet with 1500 calories and 80g of protein.  Exercising at least 150 minutes a week.  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  Discussed medication options Vitals look great Opted for phentermine again for 3 months and then evaluate Discussed side effects Fasting labs ordered Mammogram ordered     Return in about 3 months (around 05/25/2023).    Tandy Gaw, PA-C

## 2023-02-26 ENCOUNTER — Other Ambulatory Visit: Payer: Self-pay | Admitting: Physician Assistant

## 2023-02-26 ENCOUNTER — Encounter: Payer: Self-pay | Admitting: Physician Assistant

## 2023-02-26 DIAGNOSIS — E559 Vitamin D deficiency, unspecified: Secondary | ICD-10-CM

## 2023-02-26 MED ORDER — VITAMIN D (ERGOCALCIFEROL) 1.25 MG (50000 UNIT) PO CAPS: 50000.0000 [IU] | ORAL_CAPSULE | ORAL | 3 refills | Status: DC

## 2023-02-26 NOTE — Progress Notes (Signed)
Vitamin D low. Sent refill on the once weekly.  HDL, good cholesterol, looks good.  LDL is not to goal but under 130.  10 year risk is under 7.5 percent for any CV event.   Marland Kitchen.The 10-year ASCVD risk score (Arnett DK, et al., 2019) is: 4.4%   Values used to calculate the score:     Age: 57 years     Sex: Female     Is Non-Hispanic African American: Yes     Diabetic: No     Tobacco smoker: No     Systolic Blood Pressure: 125 mmHg     Is BP treated: No     HDL Cholesterol: 41 mg/dL     Total Cholesterol: 191 mg/dL  Liver enzymes elevated. Could be the weight gain. Recheck in 3 months.

## 2023-05-27 ENCOUNTER — Encounter: Payer: Self-pay | Admitting: Physician Assistant

## 2023-05-27 ENCOUNTER — Ambulatory Visit: Payer: BC Managed Care – PPO | Admitting: Physician Assistant

## 2023-05-27 VITALS — BP 138/86 | HR 69 | Ht 68.0 in | Wt 211.2 lb

## 2023-05-27 DIAGNOSIS — E66811 Obesity, class 1: Secondary | ICD-10-CM | POA: Diagnosis not present

## 2023-05-27 DIAGNOSIS — M654 Radial styloid tenosynovitis [de Quervain]: Secondary | ICD-10-CM

## 2023-05-27 DIAGNOSIS — Z6832 Body mass index (BMI) 32.0-32.9, adult: Secondary | ICD-10-CM | POA: Diagnosis not present

## 2023-05-27 DIAGNOSIS — E6609 Other obesity due to excess calories: Secondary | ICD-10-CM | POA: Diagnosis not present

## 2023-05-27 DIAGNOSIS — M65949 Unspecified synovitis and tenosynovitis, unspecified hand: Secondary | ICD-10-CM

## 2023-05-27 MED ORDER — DICLOFENAC SODIUM 1 % EX GEL: 4.0000 g | Freq: Four times a day (QID) | CUTANEOUS | 1 refills | Status: AC

## 2023-05-27 MED ORDER — IBUPROFEN 800 MG PO TABS: 800.0000 mg | ORAL_TABLET | Freq: Three times a day (TID) | ORAL | 0 refills | Status: DC | PRN

## 2023-05-27 NOTE — Patient Instructions (Addendum)
Ibuprofen 800mg  2-3 times a day for 5-7 days  Wear brace for 2 weeks   De Quervain's Tenosynovitis  De Quervain's tenosynovitis is a condition that causes inflammation of the tendon on the thumb side of the wrist. Tendons are cords of tissue that connect bones to muscles. The tendons in the hand pass through a tunnel called a sheath. A slippery layer of tissue (synovium) lets the tendons move smoothly in the sheath. With de Quervain's tenosynovitis, the sheath swells or thickens, causing friction and pain. The condition is also called de Quervain's disease and de Quervain's syndrome. It occurs most often in women who are 59-47 years old. What are the causes? The exact cause of this condition is not known. It may be associated with overuse of the hand and wrist. What increases the risk? You are more likely to develop this condition if you: Use your hands far more than normal, especially if you repeat certain movements that involve twisting your hand or using a tight grip. Are pregnant. Are a middle-aged woman. Have rheumatoid arthritis. Have diabetes. What are the signs or symptoms? The main symptom of this condition is pain on the thumb side of the wrist. The pain may get worse when you grasp something or turn your wrist. Other symptoms may include: Pain that extends up the forearm. Swelling of your wrist and hand. Trouble moving the thumb and wrist. A sensation of snapping in the wrist. A bump filled with fluid (cyst) in the area of the pain. How is this diagnosed? This condition may be diagnosed based on: Your symptoms and medical history. A physical exam. During the exam, your health care provider may do a simple test Lourena Simmonds test) that involves pulling your thumb and wrist to see if this causes pain. You may also need to have an X-ray or ultrasound. How is this treated? Treatment for this condition may include: Avoiding any activity that causes pain and swelling. Taking  medicines. Anti-inflammatory medicines and corticosteroid injections may be used to reduce inflammation and relieve pain. Wearing a splint. Having surgery. This may be needed if other treatments do not work. Once the pain and swelling have gone down, you may start: Physical therapy. This includes exercises to improve movement and strength in your wrist and thumb. Occupational therapy. This includes adjusting how you move your wrist. Follow these instructions at home: If you have a splint: Wear the splint as told by your health care provider. Remove it only as told by your health care provider. Loosen the splint if your fingers tingle, become numb, or turn cold and blue. Keep the splint clean. If the splint is not waterproof: Do not let it get wet. Cover it with a watertight covering when you take a bath or a shower. Managing pain, stiffness, and swelling  Avoid movements and activities that cause pain and swelling in the wrist area. If directed, put ice on the painful area. This may be helpful after doing activities that involve the sore wrist. To do this: Put ice in a plastic bag. Place a towel between your skin and the bag. Leave the ice on for 20 minutes, 2-3 times a day. Remove the ice if your skin turns bright red. This is very important. If you cannot feel pain, heat, or cold, you have a greater risk of damage to the area. Move your fingers often to reduce stiffness and swelling. Raise (elevate) the injured area above the level of your heart while you are sitting or lying  down. General instructions Return to your normal activities as told by your health care provider. Ask your health care provider what activities are safe for you. Take over-the-counter and prescription medicines only as told by your health care provider. Keep all follow-up visits. This is important. Contact a health care provider if: Your pain medicine does not help. Your pain gets worse. You develop new  symptoms. Summary De Quervain's tenosynovitis is a condition that causes inflammation of the tendon on the thumb side of the wrist. The condition occurs most often in women who are 65-43 years old. The exact cause of this condition is not known. It may be associated with overuse of the hand and wrist. Treatment starts with avoiding activity that causes pain or swelling in the wrist area. Other treatments may include wearing a splint and taking medicine. Sometimes, surgery is needed. This information is not intended to replace advice given to you by your health care provider. Make sure you discuss any questions you have with your health care provider. Document Revised: 10/27/2019 Document Reviewed: 10/28/2019 Elsevier Patient Education  2024 [employer].

## 2023-05-27 NOTE — Progress Notes (Unsigned)
Established Patient Office Visit  Subjective   Patient ID: Bethany Murillo, female    DOB: [birth year]  Age: 57 y.o. MRN: [id number]  Chief Complaint  Patient presents with   weight managment followup visit   Hand Pain    Pain in R hand thumb and L hand thumb that runs up right forearm     HPI Pt is a 57 yo female who presents to the clinic to follow up on weight.   She was started on phentermine and feels like she did lose weight but she stopped it and gain weight back. She feels like she is just not disciplined at this time to lose weight. She does not want to try any other medication for weight right now and work on good habits.   About 1 week ago her left thumb and into forearm started to hurt and worse with movement. She denies any injury. Her husband had a brace that she has been using some and seems to help. Ibuprofen does help too. She has tried to avoid using it but then her right thumb and wrist started to hurt, not as bad, but in the same way.    .. Active Ambulatory Problems    Diagnosis Date Noted   Vitamin D deficiency 01/14/2013   Cough 12/24/2013   Allergic rhinitis 12/24/2013   Eyelid edema 12/30/2013   Gallstones and inflammation of gallbladder without obstruction 06/29/2014   Class 1 obesity due to excess calories without serious comorbidity with body mass index (BMI) of 32.0 to 32.9 in adult 08/15/2015   Right knee pain 08/15/2015   Upper airway cough syndrome 05/23/2016   Overweight (BMI 25.0-29.9) 01/17/2018   Hot flashes, menopausal 08/25/2019   Persistent cough 02/17/2020   Ankle swelling 02/29/2020   Acute left-sided low back pain without sciatica 06/28/2020   NSAID induced gastritis 05/25/2021   Constipation, chronic 05/25/2021   Generalized abdominal pain 09/12/2021   SOB (shortness of breath) 05/15/2022   De Quervain's tenosynovitis, left 05/28/2023   Resolved Ambulatory Problems    Diagnosis Date Noted   Asthmatic bronchitis 11/13/2013    Epigastric abdominal tenderness without rebound tenderness 05/25/2021   Past Medical History:  Diagnosis Date   Unspecified vitamin D deficiency 01/14/2013    ROS See HPI.    Objective:     BP 138/86   Pulse 69   Ht 5\' 8"  (1.727 m)   Wt 211 lb 4 oz (95.8 kg)   LMP 05/12/2014 (Approximate)   SpO2 98%   BMI 32.12 kg/m  BP Readings from Last 3 Encounters:  05/27/23 138/86  02/22/23 125/86  10/30/22 127/84   Wt Readings from Last 3 Encounters:  05/27/23 211 lb 4 oz (95.8 kg)  02/22/23 210 lb (95.3 kg)  10/30/22 211 lb 11.2 oz (96 kg)      Physical Exam Constitutional:      Appearance: Normal appearance.  HENT:     Head: Normocephalic.  Cardiovascular:     Rate and Rhythm: Normal rate.  Pulmonary:     Effort: Pulmonary effort is normal.  Musculoskeletal:     Comments: Left hand: Moderate tenderness to palpation over CMC joint. Severe pain with finkelstein.  No swelling, bruising or warmth noted.  Negative tinels and phalens.    Right hand: Normal ROM of motion and very minimal tenderness over CMC joint.  Negative finkelstein.  No swelling, bruising or warmth noted.  Negative tinels and phalens.   Neurological:     General: No  focal deficit present.     Mental Status: She is alert and oriented to person, place, and time.  Psychiatric:        Mood and Affect: Mood normal.       The 10-year ASCVD risk score (Arnett DK, et al., 2019) is: 6.4%    Assessment & Plan:  Marland KitchenMarland KitchenChloà was seen today for weight managment followup visit and hand pain.  Diagnoses and all orders for this visit:  Class 1 obesity due to excess calories without serious comorbidity with body mass index (BMI) of 32.0 to 32.9 in adult  De Quervain's tenosynovitis, left -     ibuprofen (ADVIL) 800 MG tablet; Take 1 tablet (800 mg total) by mouth every 8 (eight) hours as needed. -     diclofenac Sodium (VOLTAREN) 1 % GEL; Apply 4 g topically 4 (four) times daily. To affected  joint.   Continue to work on exercise 150 minutes a week, 80g of protein, and good portion sizes.  Follow up as needed.   Wear thumb spica brace for next 2 weeks. Ibuprofen TID for 5-7 days.  Voltaren gel as needed and ice twice a day.  HO given If not improving or worsening will consider xrays and labs for autoimmune arthritis.    Return in about 2 weeks (around 06/10/2023) for Follow up.    Tandy Gaw, PA-C

## 2023-05-28 ENCOUNTER — Encounter: Payer: Self-pay | Admitting: Physician Assistant

## 2023-05-28 DIAGNOSIS — M654 Radial styloid tenosynovitis [de Quervain]: Secondary | ICD-10-CM | POA: Insufficient documentation

## 2023-06-23 ENCOUNTER — Other Ambulatory Visit: Payer: Self-pay | Admitting: Physician Assistant

## 2023-06-23 DIAGNOSIS — M654 Radial styloid tenosynovitis [de Quervain]: Secondary | ICD-10-CM

## 2024-02-07 ENCOUNTER — Other Ambulatory Visit: Payer: Self-pay | Admitting: Physician Assistant

## 2024-02-07 DIAGNOSIS — E559 Vitamin D deficiency, unspecified: Secondary | ICD-10-CM

## 2024-03-05 ENCOUNTER — Telehealth: Payer: Self-pay

## 2024-03-05 NOTE — Telephone Encounter (Signed)
 Patient has declined to have a mammogram.
